# Patient Record
Sex: Female | Born: 1970 | Race: Black or African American | Hispanic: Yes | Smoking: Former smoker
Health system: Southern US, Community
[De-identification: ages and names within clinical notes are randomized; demographics above are authoritative.]

## PROBLEM LIST (undated history)

## (undated) ENCOUNTER — Ambulatory Visit (HOSPITAL_COMMUNITY): Admission: EM | Source: Home / Self Care

## (undated) DIAGNOSIS — E119 Type 2 diabetes mellitus without complications: Secondary | ICD-10-CM

## (undated) DIAGNOSIS — J45909 Unspecified asthma, uncomplicated: Secondary | ICD-10-CM

## (undated) DIAGNOSIS — I341 Nonrheumatic mitral (valve) prolapse: Secondary | ICD-10-CM

## (undated) DIAGNOSIS — I10 Essential (primary) hypertension: Secondary | ICD-10-CM

## (undated) HISTORY — PX: OTHER SURGICAL HISTORY: SHX169

## (undated) HISTORY — DX: Type 2 diabetes mellitus without complications: E11.9

## (undated) HISTORY — PX: EAR CYST EXCISION: SHX22

## (undated) HISTORY — PX: TUBAL LIGATION: SHX77

---

## 2018-09-29 DIAGNOSIS — N926 Irregular menstruation, unspecified: Secondary | ICD-10-CM | POA: Insufficient documentation

## 2018-09-29 DIAGNOSIS — Z0000 Encounter for general adult medical examination without abnormal findings: Secondary | ICD-10-CM | POA: Insufficient documentation

## 2018-09-29 DIAGNOSIS — I341 Nonrheumatic mitral (valve) prolapse: Secondary | ICD-10-CM | POA: Insufficient documentation

## 2018-09-29 DIAGNOSIS — J309 Allergic rhinitis, unspecified: Secondary | ICD-10-CM | POA: Insufficient documentation

## 2018-09-29 DIAGNOSIS — F17200 Nicotine dependence, unspecified, uncomplicated: Secondary | ICD-10-CM | POA: Insufficient documentation

## 2018-09-29 DIAGNOSIS — F32A Depression, unspecified: Secondary | ICD-10-CM | POA: Insufficient documentation

## 2019-07-19 DIAGNOSIS — T7840XA Allergy, unspecified, initial encounter: Secondary | ICD-10-CM | POA: Insufficient documentation

## 2020-01-31 ENCOUNTER — Ambulatory Visit
Admission: EM | Admit: 2020-01-31 | Discharge: 2020-01-31 | Disposition: A | Discharge status: Home / Self Care | Payer: PRIVATE HEALTH INSURANCE

## 2020-01-31 ENCOUNTER — Encounter: Payer: Self-pay | Admitting: Emergency Medicine

## 2020-01-31 ENCOUNTER — Ambulatory Visit (INDEPENDENT_AMBULATORY_CARE_PROVIDER_SITE_OTHER): Payer: PRIVATE HEALTH INSURANCE

## 2020-01-31 ENCOUNTER — Other Ambulatory Visit: Payer: Self-pay

## 2020-01-31 DIAGNOSIS — M79675 Pain in left toe(s): Secondary | ICD-10-CM | POA: Diagnosis not present

## 2020-01-31 DIAGNOSIS — M79672 Pain in left foot: Secondary | ICD-10-CM | POA: Diagnosis not present

## 2020-01-31 DIAGNOSIS — W208XXA Other cause of strike by thrown, projected or falling object, initial encounter: Secondary | ICD-10-CM | POA: Diagnosis not present

## 2020-01-31 DIAGNOSIS — S92525A Nondisplaced fracture of medial phalanx of left lesser toe(s), initial encounter for closed fracture: Secondary | ICD-10-CM | POA: Diagnosis not present

## 2020-01-31 HISTORY — DX: Essential (primary) hypertension: I10

## 2020-01-31 NOTE — ED Triage Notes (Signed)
Pt presents to [facility] for assessment of left pinky toe pain after dropping a shade on her foot 1 week ago.  Sttes she has been trying home remedies without relief, and states moving in her sleep can wake her up.

## 2020-01-31 NOTE — Discharge Instructions (Signed)
Recommend RICE: rest, ice, compression, elevation as needed for pain.    Heat therapy (hot compress, warm wash rag, hot showers, etc.) can help relax muscles and soothe muscle aches. Cold therapy (ice packs) can be used to help swelling both after injury and after prolonged use of areas of chronic pain/aches.  For pain: recommend 350 mg-1000 mg of Tylenol (acetaminophen) and/or 200 mg - 800 mg of Advil (ibuprofen, Motrin) every 8 hours as needed.  May alternate between the two throughout the day as they are generally safe to take together.  DO NOT exceed more than 3000 mg of Tylenol or 3200 mg of ibuprofen in a 24 hour period as this could damage your stomach, kidneys, liver, or increase your bleeding risk.  Important to follow up with ortho in 1 week!

## 2020-01-31 NOTE — ED Provider Notes (Signed)
[name]    CSN: [id number] Arrival date & time: 01/31/20  1531      History   Chief Complaint Chief Complaint  Patient presents with  . Foot Pain    HPI Laura Burns is a 49 y.o. female with history of hypertension presenting for persistent left small toe pain and swelling.  Patient states that she dropped her lampshade on a week ago.  Has applied ice, elevate, taken ibuprofen, though still in persistent pain.  Has been fully weightbearing.  Denies numbness or deformity.  Requesting x-ray to see if it's broken.   Past Medical History:  Diagnosis Date  . Hypertension     There are no problems to display for this patient.   Past Surgical History:  Procedure Laterality Date  . CESAREAN SECTION      OB History   No obstetric history on file.      Home Medications    Prior to Admission medications   Medication Sig Start Date End Date Taking? Authorizing Provider  albuterol (VENTOLIN HFA) 108 (90 Base) MCG/ACT inhaler Inhale into the lungs every 6 (six) hours as needed for wheezing or shortness of breath.   Yes [provider]  fluticasone-salmeterol (ADVAIR HFA) 115-21 MCG/ACT inhaler Inhale 2 puffs into the lungs 2 (two) times daily.   Yes [provider]  loratadine (CLARITIN) 10 MG tablet Take 10 mg by mouth daily.   Yes [provider]  losartan (COZAAR) 25 MG tablet Take 25 mg by mouth daily.   Yes [provider]    Family History Family History  Problem Relation Age of Onset  . Diabetes Mother   . Hypothyroidism Mother   . Diabetes Father     Social History Social History   Tobacco Use  . Smoking status: Current Some Day Smoker  . Smokeless tobacco: Never Used  . Tobacco comment: "when I'm drinking"  Substance Use Topics  . Alcohol use: Yes    Comment: socially  . Drug use: Never     Allergies   Patient has no known allergies.   Review of Systems As per HPI   Physical Exam Triage  Vital Signs ED Triage Vitals  Enc Vitals Group     BP      Pulse      Resp      Temp      Temp src      SpO2      Weight      Height      Head Circumference      Peak Flow      Pain Score      Pain Loc      Pain Edu?      Excl. in GC?    No data found.  Updated Vital Signs BP (!) 142/98 (BP Location: Left Arm)   Pulse 76   Temp 98 F (36.7 C)   Resp 18   SpO2 95%   Visual Acuity Right Eye Distance:   Left Eye Distance:   Bilateral Distance:    Right Eye Near:   Left Eye Near:    Bilateral Near:     Physical Exam Constitutional:      General: She is not in acute distress. HENT:     Head: Normocephalic and atraumatic.  Eyes:     General: No scleral icterus.    Pupils: Pupils are equal, round, and reactive to light.  Cardiovascular:     Rate and Rhythm: Normal  rate.  Pulmonary:     Effort: Pulmonary effort is normal.  Musculoskeletal:        General: Swelling and tenderness present. Normal range of motion.     Comments: Foot, fifth toe NVI  Skin:    Capillary Refill: Capillary refill takes less than 2 seconds.     Coloration: Skin is not jaundiced or pale.  Neurological:     Mental Status: She is alert and oriented to person, place, and time.      UC Treatments / Results  Labs (all labs ordered are listed, but only abnormal results are displayed) Labs Reviewed - No data to display  EKG   Radiology DG Foot Complete Left  Result Date: 01/31/2020 CLINICAL DATA:  Left foot pain after injury. Small toe pain. Blinds fell on foot. EXAM: LEFT FOOT - COMPLETE 3+ VIEW COMPARISON:  None. FINDINGS: Oblique nondisplaced fracture of the little toe middle phalanx. 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 There is no intra-articular extension to the proximal interphalangeal joint. No additional fracture of the foot. Mild hallux valgus with minimal degenerative change the first metatarsal phalangeal joint. IMPRESSION: Oblique nondisplaced  fracture of the little toe middle phalanx. Electronically Signed   By: Narda Rutherford M.D.   On: 01/31/2020 16:30    Procedures Procedures (including critical care time)  Medications Ordered in UC Medications - No data to display  Initial Impression / Assessment and Plan / UC Course  I have reviewed the triage vital signs and the nursing notes.  Pertinent labs & imaging results that were available during my care of the patient were reviewed by me and considered in my medical decision making (see chart for details).     Left foot x-ray done office, reviewed by me radiology: Significant for oblique, nondisplaced fracture of little toe middle phalanx.  Ace wrap applied in office, crutches given: Patient instructed to be nonweightbearing and follow-up with orthopedics within a week.  Return precautions discussed, patient verbalized understanding and is agreeable to plan. Final Clinical Impressions(s) / UC Diagnoses   Final diagnoses:  Closed nondisplaced fracture of middle phalanx of lesser toe of left foot, initial encounter     Discharge Instructions     Recommend RICE: rest, ice, compression, elevation as needed for pain.    Heat therapy (hot compress, warm wash rag, hot showers, etc.) can help relax muscles and soothe muscle aches. Cold therapy (ice packs) can be used to help swelling both after injury and after prolonged use of areas of chronic pain/aches.  For pain: recommend 350 mg-1000 mg of Tylenol (acetaminophen) and/or 200 mg - 800 mg of Advil (ibuprofen, Motrin) every 8 hours as needed.  May alternate between the two throughout the day as they are generally safe to take together.  DO NOT exceed more than 3000 mg of Tylenol or 3200 mg of ibuprofen in a 24 hour period as this could damage your stomach, kidneys, liver, or increase your bleeding risk.  Important to follow up with ortho in 1 week!    ED Prescriptions    None     PDMP not reviewed this encounter.    Hall[place]Potvin, [place], [place] 01/31/20 1701

## 2020-02-06 DIAGNOSIS — Z6829 Body mass index (BMI) 29.0-29.9, adult: Secondary | ICD-10-CM | POA: Insufficient documentation

## 2020-02-06 DIAGNOSIS — M79602 Pain in left arm: Secondary | ICD-10-CM | POA: Insufficient documentation

## 2020-02-13 DIAGNOSIS — E782 Mixed hyperlipidemia: Secondary | ICD-10-CM | POA: Insufficient documentation

## 2020-02-13 DIAGNOSIS — E559 Vitamin D deficiency, unspecified: Secondary | ICD-10-CM | POA: Insufficient documentation

## 2020-02-13 DIAGNOSIS — R7303 Prediabetes: Secondary | ICD-10-CM | POA: Insufficient documentation

## 2020-03-15 ENCOUNTER — Other Ambulatory Visit: Payer: Self-pay

## 2020-03-15 ENCOUNTER — Emergency Department (HOSPITAL_BASED_OUTPATIENT_CLINIC_OR_DEPARTMENT_OTHER)
Admission: EM | Admit: 2020-03-15 | Discharge: 2020-03-15 | Disposition: A | Discharge status: Home / Self Care | Payer: PRIVATE HEALTH INSURANCE | Attending: Emergency Medicine | Admitting: Emergency Medicine

## 2020-03-15 ENCOUNTER — Emergency Department (HOSPITAL_BASED_OUTPATIENT_CLINIC_OR_DEPARTMENT_OTHER): Payer: PRIVATE HEALTH INSURANCE

## 2020-03-15 ENCOUNTER — Encounter (HOSPITAL_BASED_OUTPATIENT_CLINIC_OR_DEPARTMENT_OTHER): Payer: Self-pay | Admitting: Emergency Medicine

## 2020-03-15 DIAGNOSIS — F17200 Nicotine dependence, unspecified, uncomplicated: Secondary | ICD-10-CM | POA: Diagnosis not present

## 2020-03-15 DIAGNOSIS — I10 Essential (primary) hypertension: Secondary | ICD-10-CM | POA: Diagnosis not present

## 2020-03-15 DIAGNOSIS — Z20822 Contact with and (suspected) exposure to covid-19: Secondary | ICD-10-CM | POA: Diagnosis not present

## 2020-03-15 DIAGNOSIS — Z79899 Other long term (current) drug therapy: Secondary | ICD-10-CM | POA: Diagnosis not present

## 2020-03-15 DIAGNOSIS — R0602 Shortness of breath: Secondary | ICD-10-CM | POA: Diagnosis present

## 2020-03-15 DIAGNOSIS — J45901 Unspecified asthma with (acute) exacerbation: Secondary | ICD-10-CM | POA: Diagnosis not present

## 2020-03-15 HISTORY — DX: Unspecified asthma, uncomplicated: J45.909

## 2020-03-15 LAB — SARS CORONAVIRUS 2 BY RT PCR (HOSPITAL ORDER, PERFORMED IN ~~LOC~~ HOSPITAL LAB): SARS Coronavirus 2: NEGATIVE

## 2020-03-15 MED ORDER — ALBUTEROL SULFATE HFA 108 (90 BASE) MCG/ACT IN AERS
8.0000 | INHALATION_SPRAY | Freq: Once | RESPIRATORY_TRACT | Status: AC
  Administered 2020-03-15: 8 via RESPIRATORY_TRACT
  Filled 2020-03-15: qty 6.7

## 2020-03-15 MED ORDER — PREDNISONE 20 MG PO TABS: 60.0000 mg | ORAL_TABLET | Freq: Every day | ORAL | 0 refills | Status: DC

## 2020-03-15 MED ORDER — MAGNESIUM SULFATE 50 % IJ SOLN
1.0000 g | Freq: Once | INTRAMUSCULAR | Status: AC
  Administered 2020-03-15: 1 g via INTRAVENOUS
  Filled 2020-03-15: qty 2

## 2020-03-15 MED ORDER — ALBUTEROL SULFATE (2.5 MG/3ML) 0.083% IN NEBU
2.5000 mg | INHALATION_SOLUTION | Freq: Four times a day (QID) | RESPIRATORY_TRACT | 12 refills | Status: DC | PRN
Start: 2020-03-15 — End: 2020-05-15

## 2020-03-15 NOTE — ED Provider Notes (Signed)
[facility] EMERGENCY DEPARTMENT Provider Note   CSN: [id number] Arrival date & time: 03/15/20  0900     History Chief Complaint  Patient presents with  . Shortness of Breath    Laura Burns is a 49 y.o. female.  The history is provided by the patient.  Shortness of Breath Severity:  Mild Onset quality:  Gradual Timing:  Intermittent Progression:  Waxing and waning Chronicity:  New Context: activity (asthma symptoms, improved with nebulizer with EMS)   Relieved by:  Inhaler Worsened by:  Deep breathing and coughing Associated symptoms: wheezing   Associated symptoms: no abdominal pain, no chest pain, no claudication, no cough, no diaphoresis, no ear pain, no fever, no rash, no sore throat, no sputum production and no vomiting        Past Medical History:  Diagnosis Date  . Asthma   . Hypertension     There are no problems to display for this patient.   Past Surgical History:  Procedure Laterality Date  . CESAREAN SECTION       OB History   No obstetric history on file.     Family History  Problem Relation Age of Onset  . Diabetes Mother   . Hypothyroidism Mother   . Diabetes Father     Social History   Tobacco Use  . Smoking status: Current Some Day Smoker  . Smokeless tobacco: Never Used  . Tobacco comment: "when I'm drinking"  Substance Use Topics  . Alcohol use: Yes    Comment: socially  . Drug use: Never    Home Medications Prior to Admission medications   Medication Sig Start Date End Date Taking? Authorizing Provider  albuterol (PROVENTIL) (2.5 MG/3ML) 0.083% nebulizer solution Take 3 mLs (2.5 mg total) by nebulization every 6 (six) hours as needed for wheezing or shortness of breath. 03/15/20   Laura Daum, DO  albuterol (VENTOLIN HFA) 108 (90 Base) MCG/ACT inhaler Inhale into the lungs every 6 (six) hours as needed for wheezing or shortness of breath.    [provider]  fluticasone-salmeterol (ADVAIR HFA) 115-21  MCG/ACT inhaler Inhale 2 puffs into the lungs 2 (two) times daily.    [provider]  loratadine (CLARITIN) 10 MG tablet Take 10 mg by mouth daily.    [provider]  losartan (COZAAR) 25 MG tablet Take 25 mg by mouth daily.    [provider]  predniSONE (DELTASONE) 20 MG tablet Take 3 tablets (60 mg total) by mouth daily for 5 days. 03/15/20 03/20/20  Laura Eutsler, DO    Allergies    Pollen extract, Dust mite extract, and Losartan  Review of Systems   Review of Systems  Constitutional: Negative for chills, diaphoresis and fever.  HENT: Negative for ear pain and sore throat.   Eyes: Negative for pain and visual disturbance.  Respiratory: Positive for shortness of breath and wheezing. Negative for cough and sputum production.   Cardiovascular: Negative for chest pain, palpitations and claudication.  Gastrointestinal: Negative for abdominal pain and vomiting.  Genitourinary: Negative for dysuria and hematuria.  Musculoskeletal: Negative for arthralgias and back pain.  Skin: Negative for color change and rash.  Neurological: Negative for seizures and syncope.  All other systems reviewed and are negative.   Physical Exam Updated Vital Signs  ED Triage Vitals  Enc Vitals Group     BP 03/15/20 0911 (!) 148/101     Pulse Rate 03/15/20 0911 87     Resp 03/15/20 0911 (!) 24  Temp 03/15/20 0911 97.9 F (36.6 C)     Temp src --      SpO2 03/15/20 0905 99 %     Weight 03/15/20 0911 148 lb (67.1 kg)     Height 03/15/20 0911 5\' 1"  (1.549 m)     Head Circumference --      Peak Flow --      Pain Score 03/15/20 0918 2     Pain Loc --      Pain Edu? --      Excl. in GC? --     Physical Exam Vitals and nursing note reviewed.  Constitutional:      General: She is not in acute distress.    Appearance: She is well-developed. She is not ill-appearing.  HENT:     Head: Normocephalic and atraumatic.  Eyes:     Conjunctiva/sclera: Conjunctivae normal.      Pupils: Pupils are equal, round, and reactive to light.  Cardiovascular:     Rate and Rhythm: Normal rate and regular rhythm.     Pulses: Normal pulses.     Heart sounds: Normal heart sounds. No murmur heard.   Pulmonary:     Effort: Pulmonary effort is normal. No respiratory distress.     Breath sounds: Wheezing present. No decreased breath sounds.  Abdominal:     Palpations: Abdomen is soft.     Tenderness: There is no abdominal tenderness.  Musculoskeletal:     Cervical back: Normal range of motion and neck supple.     Right lower leg: No edema.     Left lower leg: No edema.  Skin:    General: Skin is warm and dry.     Capillary Refill: Capillary refill takes less than 2 seconds.  Neurological:     General: No focal deficit present.     Mental Status: She is alert.     ED Results / Procedures / Treatments   Labs (all labs ordered are listed, but only abnormal results are displayed) Labs Reviewed  SARS CORONAVIRUS 2 BY RT PCR (HOSPITAL ORDER, PERFORMED IN [facility])    EKG None  Radiology DG Chest Portable 1 View  Result Date: 03/15/2020 CLINICAL DATA:  Asthma EXAM: PORTABLE CHEST 1 VIEW COMPARISON:  None. FINDINGS: Lungs are clear. Heart size and pulmonary vascularity are normal. No adenopathy. There is midthoracic dextroscoliosis. IMPRESSION: Lungs clear.  Cardiac silhouette normal. Electronically Signed   By: 03/17/2020 III M.D.   On: 03/15/2020 10:18    Procedures Procedures (including critical care time)  Medications Ordered in ED Medications  albuterol (VENTOLIN HFA) 108 (90 Base) MCG/ACT inhaler 8 puff (8 puffs Inhalation Given 03/15/20 1012)  magnesium sulfate (IV Push/IM) injection 1 g (1 g Intravenous Given 03/15/20 1028)    ED Course  I have reviewed the triage vital signs and the nursing notes.  Pertinent labs & imaging results that were available during my care of the patient were reviewed by me and considered in my medical  decision making (see chart for details).    MDM Rules/Calculators/A&P                          Laura Burns is a 49 year old female history of asthma who presents the ED with shortness of breath, wheezing, asthma attack.  Normal vitals.  No fever.  No increased work of breathing.  No hypoxia.  Has been using home inhaler with minimal relief.  Got nebulizer,  steroids with EMS and feels better.  Has fairly good air movement on exam.  Will give further breathing treatments, magnesium.  Chest x-ray showed no signs of infection.  She is vaccinated against coronavirus but will check Covid test.  Chest x-ray negative for infection.  Patient felt much better after breathing treatment and magnesium.  Will prescribe steroids.  Understands return precautions.  Discharged in good condition.  This chart was dictated using voice recognition software.  Despite best efforts to proofread,  errors can occur which can change the documentation meaning.    Final Clinical Impression(s) / ED Diagnoses Final diagnoses:  Mild asthma with exacerbation, unspecified whether persistent    Rx / DC Orders ED Discharge Orders         Ordered    predniSONE (DELTASONE) 20 MG tablet  Daily        03/15/20 1044    albuterol (PROVENTIL) (2.5 MG/3ML) 0.083% nebulizer solution  Every 6 hours PRN        03/15/20 1044           Laurencia Roma, DO 03/15/20 1120

## 2020-03-15 NOTE — ED Triage Notes (Addendum)
Per EMS:  Pt out of medications.  Pt received solumedrol, 3 neb treatments, atrovent and albuterol.  IV to left hand.  No hypoxia.  Pt has received Pfizer vaccinations.  Pt has improved after medications.

## 2020-03-15 NOTE — ED Notes (Signed)
Patient given 15 mg Albuterol, 1.0 mg Atrovent via EMS.  BBS expiratory wheezes t/o, SpO2 98% RR 24. From [place] out of meds, speaking in complete sentences at this time.

## 2020-05-12 ENCOUNTER — Encounter (HOSPITAL_BASED_OUTPATIENT_CLINIC_OR_DEPARTMENT_OTHER): Payer: Self-pay | Admitting: Emergency Medicine

## 2020-05-12 ENCOUNTER — Inpatient Hospital Stay (HOSPITAL_BASED_OUTPATIENT_CLINIC_OR_DEPARTMENT_OTHER)
Admission: EM | Admit: 2020-05-12 | Discharge: 2020-05-15 | DRG: 203 | Disposition: A | Discharge status: Home / Self Care | Payer: Medicaid Other | Attending: Internal Medicine | Admitting: Internal Medicine

## 2020-05-12 ENCOUNTER — Other Ambulatory Visit: Payer: Self-pay

## 2020-05-12 ENCOUNTER — Emergency Department (HOSPITAL_BASED_OUTPATIENT_CLINIC_OR_DEPARTMENT_OTHER): Payer: Medicaid Other

## 2020-05-12 DIAGNOSIS — I10 Essential (primary) hypertension: Secondary | ICD-10-CM | POA: Diagnosis present

## 2020-05-12 DIAGNOSIS — E876 Hypokalemia: Secondary | ICD-10-CM | POA: Diagnosis present

## 2020-05-12 DIAGNOSIS — F17200 Nicotine dependence, unspecified, uncomplicated: Secondary | ICD-10-CM | POA: Diagnosis present

## 2020-05-12 DIAGNOSIS — Z7951 Long term (current) use of inhaled steroids: Secondary | ICD-10-CM

## 2020-05-12 DIAGNOSIS — Z79899 Other long term (current) drug therapy: Secondary | ICD-10-CM

## 2020-05-12 DIAGNOSIS — J45901 Unspecified asthma with (acute) exacerbation: Secondary | ICD-10-CM | POA: Diagnosis present

## 2020-05-12 DIAGNOSIS — J4540 Moderate persistent asthma, uncomplicated: Secondary | ICD-10-CM

## 2020-05-12 DIAGNOSIS — Z23 Encounter for immunization: Secondary | ICD-10-CM

## 2020-05-12 DIAGNOSIS — Z20822 Contact with and (suspected) exposure to covid-19: Secondary | ICD-10-CM | POA: Diagnosis present

## 2020-05-12 DIAGNOSIS — J4541 Moderate persistent asthma with (acute) exacerbation: Principal | ICD-10-CM | POA: Diagnosis present

## 2020-05-12 DIAGNOSIS — R519 Headache, unspecified: Secondary | ICD-10-CM | POA: Diagnosis present

## 2020-05-12 DIAGNOSIS — E785 Hyperlipidemia, unspecified: Secondary | ICD-10-CM | POA: Diagnosis present

## 2020-05-12 LAB — BASIC METABOLIC PANEL
Anion gap: 9 (ref 5–15)
BUN: 13 mg/dL (ref 6–20)
CO2: 26 mmol/L (ref 22–32)
Calcium: 9.1 mg/dL (ref 8.9–10.3)
Chloride: 107 mmol/L (ref 98–111)
Creatinine, Ser: 0.98 mg/dL (ref 0.44–1.00)
GFR, Estimated: >60 mL/min (ref >60)
Glucose, Bld: 110 mg/dL — ABNORMAL HIGH (ref 70–99)
Potassium: 4 mmol/L (ref 3.5–5.1)
Sodium: 142 mmol/L (ref 135–145)

## 2020-05-12 LAB — CBC WITH DIFFERENTIAL/PLATELET
Abs Immature Granulocytes: 0.02 10*3/uL (ref 0.00–0.07)
Basophils Absolute: 0 10*3/uL (ref 0.0–0.1)
Basophils Relative: 1 %
Eosinophils Absolute: 1.7 10*3/uL — ABNORMAL HIGH (ref 0.0–0.5)
Eosinophils Relative: 20 %
HCT: 43.9 % (ref 36.0–46.0)
Hemoglobin: 14.6 g/dL (ref 12.0–15.0)
Immature Granulocytes: 0 %
Lymphocytes Relative: 35 %
Lymphs Abs: 3 10*3/uL (ref 0.7–4.0)
MCH: 30.8 pg (ref 26.0–34.0)
MCHC: 33.3 g/dL (ref 30.0–36.0)
MCV: 92.6 fL (ref 80.0–100.0)
Monocytes Absolute: 0.7 10*3/uL (ref 0.1–1.0)
Monocytes Relative: 8 %
Neutro Abs: 3 10*3/uL (ref 1.7–7.7)
Neutrophils Relative %: 36 %
Platelets: 245 10*3/uL (ref 150–400)
RBC: 4.74 MIL/uL (ref 3.87–5.11)
RDW: 12.8 % (ref 11.5–15.5)
WBC: 8.4 10*3/uL (ref 4.0–10.5)
nRBC: 0 % (ref 0.0–0.2)

## 2020-05-12 LAB — RESPIRATORY PANEL BY RT PCR (FLU A&B, COVID)
Influenza A by PCR: NEGATIVE
Influenza B by PCR: NEGATIVE
SARS Coronavirus 2 by RT PCR: NEGATIVE

## 2020-05-12 MED ORDER — ACETAMINOPHEN 325 MG PO TABS
650.0000 mg | ORAL_TABLET | Freq: Once | ORAL | Status: AC
  Administered 2020-05-12: 650 mg via ORAL
  Filled 2020-05-12: qty 2

## 2020-05-12 MED ORDER — SODIUM CHLORIDE 0.9 % IV SOLN
INTRAVENOUS | Status: DC | PRN
  Administered 2020-05-12: 250 mL via INTRAVENOUS

## 2020-05-12 MED ORDER — ALBUTEROL (5 MG/ML) CONTINUOUS INHALATION SOLN: 15.0000 mg/h | INHALATION_SOLUTION | RESPIRATORY_TRACT | Status: AC

## 2020-05-12 MED ORDER — MAGNESIUM SULFATE 2 GM/50ML IV SOLN
2.0000 g | Freq: Once | INTRAVENOUS | Status: AC
  Administered 2020-05-12: 2 g via INTRAVENOUS
  Filled 2020-05-12: qty 50

## 2020-05-12 MED ORDER — METHYLPREDNISOLONE SODIUM SUCC 125 MG IJ SOLR
125.0000 mg | Freq: Once | INTRAMUSCULAR | Status: AC
  Administered 2020-05-12: 125 mg via INTRAVENOUS
  Filled 2020-05-12: qty 2

## 2020-05-12 MED ORDER — ALBUTEROL SULFATE (2.5 MG/3ML) 0.083% IN NEBU
2.5000 mg | INHALATION_SOLUTION | Freq: Once | RESPIRATORY_TRACT | Status: AC
  Administered 2020-05-12: 2.5 mg via RESPIRATORY_TRACT

## 2020-05-12 MED ORDER — ALBUTEROL (5 MG/ML) CONTINUOUS INHALATION SOLN
15.0000 mg/h | INHALATION_SOLUTION | RESPIRATORY_TRACT | Status: DC
  Administered 2020-05-12: 15 mg/h via RESPIRATORY_TRACT

## 2020-05-12 MED ORDER — ALBUTEROL (5 MG/ML) CONTINUOUS INHALATION SOLN
INHALATION_SOLUTION | RESPIRATORY_TRACT | Status: AC
  Administered 2020-05-12: 15 mg/h via RESPIRATORY_TRACT
  Filled 2020-05-12: qty 20

## 2020-05-12 MED ORDER — IPRATROPIUM-ALBUTEROL 0.5-2.5 (3) MG/3ML IN SOLN
3.0000 mL | Freq: Once | RESPIRATORY_TRACT | Status: AC
  Administered 2020-05-12: 3 mL via RESPIRATORY_TRACT

## 2020-05-12 NOTE — ED Triage Notes (Signed)
Pt to ED with c/o SHOB, worsening x 1 week; difficult to obtain hx d/t pt resp distress

## 2020-05-12 NOTE — ED Provider Notes (Signed)
[facility] EMERGENCY DEPARTMENT Provider Note  CSN: [id number] Arrival date & time: 05/12/20 1935    History Chief Complaint  Patient presents with  . Shortness of Breath    HPI  Laura Burns is a 49 y.o. female with history of asthma who reports she has had increasing SOB, wheezing for the last week or so, taking her inhaler and nebs at home but still worsening until today when she got bad enough to come to the hospital. No fever. Does not think she has covid. No CP   Past Medical History:  Diagnosis Date  . Asthma   . Hypertension     Past Surgical History:  Procedure Laterality Date  . CESAREAN SECTION      Family History  Problem Relation Age of Onset  . Diabetes Mother   . Hypothyroidism Mother   . Diabetes Father     Social History   Tobacco Use  . Smoking status: Current Some Day Smoker  . Smokeless tobacco: Never Used  . Tobacco comment: "when I'm drinking"  Substance Use Topics  . Alcohol use: Yes    Comment: socially  . Drug use: Never     Home Medications Prior to Admission medications   Medication Sig Start Date End Date Taking? Authorizing Provider  albuterol (PROVENTIL) (2.5 MG/3ML) 0.083% nebulizer solution Take 3 mLs (2.5 mg total) by nebulization every 6 (six) hours as needed for wheezing or shortness of breath. 03/15/20   Curatolo, Adam, DO  albuterol (VENTOLIN HFA) 108 (90 Base) MCG/ACT inhaler Inhale into the lungs every 6 (six) hours as needed for wheezing or shortness of breath.    [provider]  fluticasone-salmeterol (ADVAIR HFA) 115-21 MCG/ACT inhaler Inhale 2 puffs into the lungs 2 (two) times daily.    [provider]  loratadine (CLARITIN) 10 MG tablet Take 10 mg by mouth daily.    [provider]  losartan (COZAAR) 25 MG tablet Take 25 mg by mouth daily.    [provider]     Allergies    Pollen extract, Dust mite extract, and Losartan   Review of Systems   Review of  Systems A comprehensive review of systems was completed and negative except as noted in HPI.    Physical Exam BP (!) 134/110 Comment: 11 L aerosol mask  Pulse (!) 124 Comment: 11 L aerosol mask  Resp (!) 29 Comment: 11 L aerosol mask  Ht 5\' 1"  (1.549 m)   Wt 72.6 kg   SpO2 100%   BMI 30.23 kg/m   Physical Exam Vitals and nursing note reviewed.  Constitutional:      General: She is in acute distress.     Appearance: Normal appearance.  HENT:     Head: Normocephalic and atraumatic.     Nose: Nose normal.     Mouth/Throat:     Mouth: Mucous membranes are moist.  Eyes:     Extraocular Movements: Extraocular movements intact.     Conjunctiva/sclera: Conjunctivae normal.  Cardiovascular:     Rate and Rhythm: Normal rate.  Pulmonary:     Effort: Tachypnea, accessory muscle usage and respiratory distress present.     Breath sounds: Wheezing present.  Abdominal:     General: Abdomen is flat.     Palpations: Abdomen is soft.     Tenderness: There is no abdominal tenderness.  Musculoskeletal:        General: No swelling. Normal range of motion.     Cervical back: Neck  supple.  Skin:    General: Skin is warm and dry.  Neurological:     General: No focal deficit present.     Mental Status: She is alert.  Psychiatric:        Mood and Affect: Mood normal.      ED Results / Procedures / Treatments   Labs (all labs ordered are listed, but only abnormal results are displayed) Labs Reviewed  BASIC METABOLIC PANEL - Abnormal; Notable for the following components:      Result Value   Glucose, Bld 110 (*)    All other components within normal limits  CBC WITH DIFFERENTIAL/PLATELET - Abnormal; Notable for the following components:   Eosinophils Absolute 1.7 (*)    All other components within normal limits  RESPIRATORY PANEL BY RT PCR (FLU A&B, COVID)    EKG EKG Interpretation  Date/Time:  Saturday May 12 2020 19:45:25 EDT Ventricular Rate:  113 PR Interval:    QRS  Duration: 116 QT Interval:  333 QTC Calculation: 457 R Axis:   66 Text Interpretation: Sinus tachycardia Nonspecific intraventricular conduction delay Artifact in lead(s) I II aVR aVL aVF and baseline wander in lead(s) V3 No old tracing to compare Confirmed by Susy Frizzle [id number]) on 05/12/2020 7:54:41 PM    Radiology DG Chest Port 1 View  Result Date: 05/12/2020 CLINICAL DATA:  Shortness of breath EXAM: PORTABLE CHEST 1 VIEW COMPARISON:  03/15/2020 FINDINGS: The heart size and mediastinal contours are within normal limits. Both lungs are clear. The visualized skeletal structures are unremarkable. IMPRESSION: No active disease. Electronically Signed   By: Alcide Clever M.D.   On: 05/12/2020 20:12    Procedures .Critical Care Performed by: Pollyann Savoy, MD Authorized by: Pollyann Savoy, MD   Critical care provider statement:    Critical care time (minutes):  45   Critical care was necessary to treat or prevent imminent or life-threatening deterioration of the following conditions:  Respiratory failure   Critical care was time spent personally by me on the following activities:  Discussions with consultants, evaluation of patient's response to treatment, examination of patient, ordering and performing treatments and interventions, ordering and review of laboratory studies, ordering and review of radiographic studies, pulse oximetry, re-evaluation of patient's condition, obtaining history from patient or surrogate and review of old charts    Medications Ordered in the ED Medications  albuterol (PROVENTIL,VENTOLIN) solution continuous neb (0 mg/hr Nebulization Stopped 05/12/20 2136)  0.9 %  sodium chloride infusion (250 mLs Intravenous New Bag/Given 05/12/20 2016)  albuterol (PROVENTIL,VENTOLIN) solution continuous neb (15 mg/hr Nebulization New Bag/Given 05/12/20 2141)  ipratropium-albuterol (DUONEB) 0.5-2.5 (3) MG/3ML nebulizer solution 3 mL (3 mLs Nebulization Given  05/12/20 2005)  magnesium sulfate IVPB 2 g 50 mL ( Intravenous Stopped 05/12/20 2120)  methylPREDNISolone sodium succinate (SOLU-MEDROL) 125 mg/2 mL injection 125 mg (125 mg Intravenous Given 05/12/20 2010)  albuterol (PROVENTIL) (2.5 MG/3ML) 0.083% nebulizer solution 2.5 mg (2.5 mg Nebulization Given 05/12/20 2005)  acetaminophen (TYLENOL) tablet 650 mg (650 mg Oral Given 05/12/20 2209)     MDM Rules/Calculators/A&P MDM Patient likely asthma exacerbation, givne Duoneb and then placed on a 15mg  albuterol continuous neb. Magnesium and solumedrol ordered. Labs and CXR, Covid swab.  ED Course  I have reviewed the triage vital signs and the nursing notes.  Pertinent labs & imaging results that were available during my care of the patient were reviewed by me and considered in my medical decision making (see chart for details).  Clinical  Course as of May 12 2253  Sat May 12, 2020  2038 CBC, CMP and CXR are all unremarkable.    [CS]  2102 Covid is negative. Plan admission for asthma exacerbation.    [CS]  2155 Patient continues to have significant wheezing, improved WOB and SpO2, hospitalist paged.    [CS]  2245 Spoke with Dr. Antionette Char, Hospitalist, who will admit.    [CS]    Clinical Course User Index [CS] Pollyann Savoy, MD    Final Clinical Impression(s) / ED Diagnoses Final diagnoses:  Exacerbation of asthma, unspecified asthma severity, unspecified whether persistent    Rx / DC Orders ED Discharge Orders    None       Pollyann Savoy, MD 05/12/20 2255

## 2020-05-13 DIAGNOSIS — J4541 Moderate persistent asthma with (acute) exacerbation: Principal | ICD-10-CM

## 2020-05-13 DIAGNOSIS — R519 Headache, unspecified: Secondary | ICD-10-CM | POA: Diagnosis present

## 2020-05-13 DIAGNOSIS — Z23 Encounter for immunization: Secondary | ICD-10-CM | POA: Diagnosis not present

## 2020-05-13 DIAGNOSIS — J4540 Moderate persistent asthma, uncomplicated: Secondary | ICD-10-CM

## 2020-05-13 DIAGNOSIS — E876 Hypokalemia: Secondary | ICD-10-CM | POA: Diagnosis present

## 2020-05-13 DIAGNOSIS — I10 Essential (primary) hypertension: Secondary | ICD-10-CM

## 2020-05-13 DIAGNOSIS — E785 Hyperlipidemia, unspecified: Secondary | ICD-10-CM | POA: Diagnosis present

## 2020-05-13 DIAGNOSIS — Z20822 Contact with and (suspected) exposure to covid-19: Secondary | ICD-10-CM | POA: Diagnosis present

## 2020-05-13 DIAGNOSIS — R0602 Shortness of breath: Secondary | ICD-10-CM | POA: Diagnosis present

## 2020-05-13 DIAGNOSIS — F17200 Nicotine dependence, unspecified, uncomplicated: Secondary | ICD-10-CM | POA: Diagnosis present

## 2020-05-13 DIAGNOSIS — Z79899 Other long term (current) drug therapy: Secondary | ICD-10-CM | POA: Diagnosis not present

## 2020-05-13 DIAGNOSIS — Z7951 Long term (current) use of inhaled steroids: Secondary | ICD-10-CM | POA: Diagnosis not present

## 2020-05-13 LAB — CBC WITH DIFFERENTIAL/PLATELET
Abs Immature Granulocytes: 0.02 10*3/uL (ref 0.00–0.07)
Basophils Absolute: 0 10*3/uL (ref 0.0–0.1)
Basophils Relative: 0 %
Eosinophils Absolute: 0 10*3/uL (ref 0.0–0.5)
Eosinophils Relative: 0 %
HCT: 41.4 % (ref 36.0–46.0)
Hemoglobin: 13.5 g/dL (ref 12.0–15.0)
Immature Granulocytes: 0 %
Lymphocytes Relative: 6 %
Lymphs Abs: 0.5 10*3/uL — ABNORMAL LOW (ref 0.7–4.0)
MCH: 30.9 pg (ref 26.0–34.0)
MCHC: 32.6 g/dL (ref 30.0–36.0)
MCV: 94.7 fL (ref 80.0–100.0)
Monocytes Absolute: 0.1 10*3/uL (ref 0.1–1.0)
Monocytes Relative: 1 %
Neutro Abs: 8.1 10*3/uL — ABNORMAL HIGH (ref 1.7–7.7)
Neutrophils Relative %: 93 %
Platelets: 193 10*3/uL (ref 150–400)
RBC: 4.37 MIL/uL (ref 3.87–5.11)
RDW: 12.9 % (ref 11.5–15.5)
WBC: 8.8 10*3/uL (ref 4.0–10.5)
nRBC: 0 % (ref 0.0–0.2)

## 2020-05-13 LAB — BASIC METABOLIC PANEL
Anion gap: 13 (ref 5–15)
BUN: 13 mg/dL (ref 6–20)
CO2: 19 mmol/L — ABNORMAL LOW (ref 22–32)
Calcium: 8.1 mg/dL — ABNORMAL LOW (ref 8.9–10.3)
Chloride: 106 mmol/L (ref 98–111)
Creatinine, Ser: 1 mg/dL (ref 0.44–1.00)
GFR, Estimated: >60 mL/min (ref >60)
Glucose, Bld: 273 mg/dL — ABNORMAL HIGH (ref 70–99)
Potassium: 3 mmol/L — ABNORMAL LOW (ref 3.5–5.1)
Sodium: 138 mmol/L (ref 135–145)

## 2020-05-13 LAB — MAGNESIUM: Magnesium: 2.3 mg/dL (ref 1.7–2.4)

## 2020-05-13 LAB — HIV ANTIBODY (ROUTINE TESTING W REFLEX): HIV Screen 4th Generation wRfx: NONREACTIVE

## 2020-05-13 LAB — MRSA PCR SCREENING: MRSA by PCR: NEGATIVE

## 2020-05-13 MED ORDER — LABETALOL HCL 5 MG/ML IV SOLN: 10.0000 mg | INTRAVENOUS | Status: DC | PRN

## 2020-05-13 MED ORDER — CHLORHEXIDINE GLUCONATE CLOTH 2 % EX PADS
6.0000 | MEDICATED_PAD | Freq: Every day | CUTANEOUS | Status: DC
  Administered 2020-05-13 – 2020-05-14 (×3): 6 via TOPICAL

## 2020-05-13 MED ORDER — IBUPROFEN 200 MG PO TABS
400.0000 mg | ORAL_TABLET | ORAL | Status: DC | PRN
  Administered 2020-05-13 (×2): 400 mg via ORAL
  Filled 2020-05-13 (×2): qty 2

## 2020-05-13 MED ORDER — INFLUENZA VAC SPLIT QUAD 0.5 ML IM SUSY
0.5000 mL | PREFILLED_SYRINGE | INTRAMUSCULAR | Status: AC
  Administered 2020-05-14: 0.5 mL via INTRAMUSCULAR
  Filled 2020-05-13: qty 0.5

## 2020-05-13 MED ORDER — ALBUTEROL (5 MG/ML) CONTINUOUS INHALATION SOLN
10.0000 mg/h | INHALATION_SOLUTION | RESPIRATORY_TRACT | Status: DC
  Administered 2020-05-13: 10 mg/h via RESPIRATORY_TRACT
  Filled 2020-05-13: qty 20

## 2020-05-13 MED ORDER — IPRATROPIUM-ALBUTEROL 0.5-2.5 (3) MG/3ML IN SOLN
3.0000 mL | Freq: Four times a day (QID) | RESPIRATORY_TRACT | Status: DC | PRN
  Administered 2020-05-13: 3 mL via RESPIRATORY_TRACT
  Filled 2020-05-13: qty 3

## 2020-05-13 MED ORDER — CHLORHEXIDINE GLUCONATE 0.12 % MT SOLN: 15.0000 mL | Freq: Two times a day (BID) | OROMUCOSAL | Status: DC

## 2020-05-13 MED ORDER — IPRATROPIUM BROMIDE 0.02 % IN SOLN
1.0000 mg | Freq: Once | RESPIRATORY_TRACT | Status: AC
  Administered 2020-05-13: 1 mg via RESPIRATORY_TRACT
  Filled 2020-05-13: qty 5

## 2020-05-13 MED ORDER — ACETAMINOPHEN 650 MG RE SUPP: 650.0000 mg | Freq: Four times a day (QID) | RECTAL | Status: DC | PRN

## 2020-05-13 MED ORDER — ENOXAPARIN SODIUM 40 MG/0.4ML ~~LOC~~ SOLN
40.0000 mg | Freq: Every day | SUBCUTANEOUS | Status: DC
  Administered 2020-05-13 – 2020-05-14 (×2): 40 mg via SUBCUTANEOUS
  Filled 2020-05-13 (×3): qty 0.4

## 2020-05-13 MED ORDER — IPRATROPIUM-ALBUTEROL 0.5-2.5 (3) MG/3ML IN SOLN
3.0000 mL | Freq: Four times a day (QID) | RESPIRATORY_TRACT | Status: DC
  Administered 2020-05-13 – 2020-05-15 (×9): 3 mL via RESPIRATORY_TRACT
  Filled 2020-05-13 (×9): qty 3

## 2020-05-13 MED ORDER — HYDROCOD POLST-CPM POLST ER 10-8 MG/5ML PO SUER
5.0000 mL | Freq: Two times a day (BID) | ORAL | Status: DC | PRN
  Administered 2020-05-13 – 2020-05-14 (×3): 5 mL via ORAL
  Filled 2020-05-13 (×3): qty 5

## 2020-05-13 MED ORDER — FLUTICASONE FUROATE-VILANTEROL 200-25 MCG/INH IN AEPB
1.0000 | INHALATION_SPRAY | Freq: Every day | RESPIRATORY_TRACT | Status: DC
  Administered 2020-05-13 – 2020-05-15 (×3): 1 via RESPIRATORY_TRACT
  Filled 2020-05-13: qty 28

## 2020-05-13 MED ORDER — ORAL CARE MOUTH RINSE: 15.0000 mL | Freq: Two times a day (BID) | OROMUCOSAL | Status: DC

## 2020-05-13 MED ORDER — PNEUMOCOCCAL VAC POLYVALENT 25 MCG/0.5ML IJ INJ
0.5000 mL | INJECTION | INTRAMUSCULAR | Status: AC
  Administered 2020-05-14: 0.5 mL via INTRAMUSCULAR
  Filled 2020-05-13: qty 0.5

## 2020-05-13 MED ORDER — METHYLPREDNISOLONE SODIUM SUCC 40 MG IJ SOLR
40.0000 mg | Freq: Two times a day (BID) | INTRAMUSCULAR | Status: DC
  Administered 2020-05-13 – 2020-05-14 (×4): 40 mg via INTRAVENOUS
  Filled 2020-05-13 (×6): qty 1

## 2020-05-13 MED ORDER — PREDNISONE 20 MG PO TABS
40.0000 mg | ORAL_TABLET | Freq: Every day | ORAL | Status: DC
  Administered 2020-05-13: 40 mg via ORAL
  Filled 2020-05-13: qty 2

## 2020-05-13 MED ORDER — ACETAMINOPHEN 325 MG PO TABS
650.0000 mg | ORAL_TABLET | Freq: Four times a day (QID) | ORAL | Status: DC | PRN
  Administered 2020-05-13 – 2020-05-14 (×3): 650 mg via ORAL
  Filled 2020-05-13 (×3): qty 2

## 2020-05-13 MED ORDER — POTASSIUM CHLORIDE CRYS ER 20 MEQ PO TBCR
40.0000 meq | EXTENDED_RELEASE_TABLET | ORAL | Status: AC
  Administered 2020-05-13 (×2): 40 meq via ORAL
  Filled 2020-05-13 (×2): qty 2

## 2020-05-13 MED ORDER — ORAL CARE MOUTH RINSE
15.0000 mL | Freq: Two times a day (BID) | OROMUCOSAL | Status: DC
  Administered 2020-05-13 – 2020-05-14 (×4): 15 mL via OROMUCOSAL

## 2020-05-13 MED ORDER — SODIUM CHLORIDE 0.9% FLUSH
3.0000 mL | Freq: Two times a day (BID) | INTRAVENOUS | Status: DC
  Administered 2020-05-13 – 2020-05-15 (×6): 3 mL via INTRAVENOUS

## 2020-05-13 MED ORDER — HYDRALAZINE HCL 25 MG PO TABS: 25.0000 mg | ORAL_TABLET | ORAL | Status: DC | PRN

## 2020-05-13 NOTE — Assessment & Plan Note (Signed)
-  Patient requests to be restarted on Advair at discharge -See asthma exacerbation treatment

## 2020-05-13 NOTE — Progress Notes (Signed)
PROGRESS NOTE    Laura Burns  KVQ:[id number] DOB: Dec 10, 1970 DOA: 05/12/2020 PCP: Patient, No Pcp Per   Chief Complain: Shortness of breath, wheezing  Brief Narrative: 49 year old female with history of moderate persistent asthma, hypertension who presents to med [facility] with worsening shortness of breath and wheezing at home.  She was taking Advair before but not taking now because she lost her insurance.  On presentation she was hemodynamically stable but tachycardic.  Chest x-ray did not show any infiltrates.  Lab works were normal.  Covid screening test was negative.  She required oxygenation for maintenance of saturation.  She is not on home oxygen.  She was treated with magnesium sulfate, Solu-Medrol, nebulization treatment.  Patient was admitted for the management of asthma exacerbation.  Assessment & Plan:   Principal Problem:   Acute asthma exacerbation Active Problems:   Moderate persistent asthma   Hypertension   Acute asthma exacerbation: Has history of moderate persistent asthma.  Presented with cough, wheezing, shortness of breath from home.  Currently not using any inhalers at home.  Started on bronchodilators, steroids.  Currently on prednisone, since she is still wheezing, will change to Solu-Medrol.  Continue current medications.  She is on 2 to 3 L of oxygen per minute.  Not on oxygen at home.  We will try to wean the oxygen to off.  Chest x-ray on presentation did not show any pneumonia.  Hypertension: Currently blood pressure stable.  Monitor blood pressure.  Takes losartan at home  Hyperlipidemia: On Lipitor at home.  Hypokalemia: Being supplemented with potassium.  We will try to arrange follow-up with a PCP, pulmonology as an outpatient during discharge.         DVT prophylaxis: Lovenox Code Status: Full Family Communication: None at bed side Status is: Inpatient  Remains inpatient appropriate because:Hemodynamically unstable and Inpatient  level of care appropriate due to severity of illness   Dispo: The patient is from: Home              Anticipated d/c is to: Home              Anticipated d/c date is: 1 day              Patient currently is not medically stable to d/c.        Consultants: None  Procedures: None  Antimicrobials:  Anti-infectives (From admission, onward)   None      Subjective: Patient seen and examined at bedside this morning.  Hemodynamically stable during my evaluation but she was in sinus tachycardia.  She still has severe bilateral expiratory wheezes.  States she gets 3-4 attacks a week recently but has been going on nonstop from July.  Objective: Vitals:   05/13/20 0400 05/13/20 0500 05/13/20 0600 05/13/20 0700  BP: (!) 91/42 (!) 125/50 (!) 128/49 (!) 132/58  Pulse: (!) 102 (!) 104 96 (!) 102  Resp: (!) 23 16 15 17   Temp: 97.6 F (36.4 C)     TempSrc: Oral     SpO2: 99% 97% 93% 92%  Weight:      Height:        Intake/Output Summary (Last 24 hours) at 05/13/2020 0738 Last data filed at 05/13/2020 0541 Gross per 24 hour  Intake 1074.63 ml  Output --  Net 1074.63 ml   Filed Weights   05/12/20 1958 05/13/20 0126  Weight: 72.6 kg 73.2 kg    Examination:  General exam: In mild respiratory distress due to  cough and wheezing  HEENT:PERRL,Oral mucosa moist, Ear/Nose normal on gross exam Respiratory system: Bilateral expiratory wheezes  cardiovascular system: Sinus tachycardia, no JVD, murmurs, rubs, gallops or clicks. No pedal edema. Gastrointestinal system: Abdomen is nondistended, soft and nontender. No organomegaly or masses felt. Normal bowel sounds heard. Central nervous system: Alert and oriented. No focal neurological deficits. Extremities: No edema, no clubbing ,no cyanosis, distal peripheral pulses palpable. Skin: No rashes, lesions or ulcers,no icterus ,no pallor    Data Reviewed: I have personally reviewed following labs and imaging studies  CBC: Recent Labs   Lab 05/12/20 2002 05/13/20 0249  WBC 8.4 8.8  NEUTROABS 3.0 8.1*  HGB 14.6 13.5  HCT 43.9 41.4  MCV 92.6 94.7  PLT 245 193   Basic Metabolic Panel: Recent Labs  Lab 05/12/20 2002 05/13/20 0249  NA 142 138  K 4.0 3.0*  CL 107 106  CO2 26 19*  GLUCOSE 110* 273*  BUN 13 13  CREATININE 0.98 1.00  CALCIUM 9.1 8.1*  MG  --  2.3   GFR: Estimated Creatinine Clearance: 61.7 mL/min (by C-G formula based on SCr of 1 mg/dL). Liver Function Tests: No results for input(s): AST, ALT, ALKPHOS, BILITOT, PROT, ALBUMIN in the last 168 hours. No results for input(s): LIPASE, AMYLASE in the last 168 hours. No results for input(s): AMMONIA in the last 168 hours. Coagulation Profile: No results for input(s): INR, PROTIME in the last 168 hours. Cardiac Enzymes: No results for input(s): CKTOTAL, CKMB, CKMBINDEX, TROPONINI in the last 168 hours. BNP (last 3 results) No results for input(s): PROBNP in the last 8760 hours. HbA1C: No results for input(s): HGBA1C in the last 72 hours. CBG: No results for input(s): GLUCAP in the last 168 hours. Lipid Profile: No results for input(s): CHOL, HDL, LDLCALC, TRIG, CHOLHDL, LDLDIRECT in the last 72 hours. Thyroid Function Tests: No results for input(s): TSH, T4TOTAL, FREET4, T3FREE, THYROIDAB in the last 72 hours. Anemia Panel: No results for input(s): VITAMINB12, FOLATE, FERRITIN, TIBC, IRON, RETICCTPCT in the last 72 hours. Sepsis Labs: No results for input(s): PROCALCITON, LATICACIDVEN in the last 168 hours.  Recent Results (from the past 240 hour(s))  Respiratory Panel by RT PCR (Flu A&B, Covid) - Nasopharyngeal Swab     Status: None   Collection Time: 05/12/20  8:02 PM   Specimen: Nasopharyngeal Swab  Result Value Ref Range Status   SARS Coronavirus 2 by RT PCR NEGATIVE NEGATIVE Final    Comment: (NOTE) SARS-CoV-2 target nucleic acids are NOT DETECTED.  The SARS-CoV-2 RNA is generally detectable in upper respiratoy specimens during the  acute phase of infection. The lowest concentration of SARS-CoV-2 viral copies this assay can detect is 131 copies/mL. A negative result does not preclude SARS-Cov-2 infection and should not be used as the sole basis for treatment or other patient management decisions. A negative result may occur with  improper specimen collection/handling, submission of specimen other than nasopharyngeal swab, presence of viral mutation(s) within the areas targeted by this assay, and inadequate number of viral copies (<131 copies/mL). A negative result must be combined with clinical observations, patient history, and epidemiological information. The expected result is Negative.  Fact Sheet for Patients:  https://www.moore.com/  Fact Sheet for Healthcare Providers:  https://www.young.biz/  This test is no t yet approved or cleared by the [place] FDA and  has been authorized for detection and/or diagnosis of SARS-CoV-2 by FDA under an Emergency Use Authorization (EUA). This EUA will remain  in effect (meaning this  test can be used) for the duration of the COVID-19 declaration under Section 564(b)(1) of the Act, 21 U.S.C. section 360bbb-3(b)(1), unless the authorization is terminated or revoked sooner.     Influenza A by PCR NEGATIVE NEGATIVE Final   Influenza B by PCR NEGATIVE NEGATIVE Final    Comment: (NOTE) The Xpert Xpress SARS-CoV-2/FLU/RSV assay is intended as an aid in  the diagnosis of influenza from Nasopharyngeal swab specimens and  should not be used as a sole basis for treatment. Nasal washings and  aspirates are unacceptable for Xpert Xpress SARS-CoV-2/FLU/RSV  testing.  Fact Sheet for Patients: https://www.moore.com/  Fact Sheet for Healthcare Providers: https://www.young.biz/  This test is not yet approved or cleared by the [place] FDA and  has been authorized for detection and/or diagnosis  of SARS-CoV-2 by  FDA under an Emergency Use Authorization (EUA). This EUA will remain  in effect (meaning this test can be used) for the duration of the  Covid-19 declaration under Section 564(b)(1) of the Act, 21  U.S.C. section 360bbb-3(b)(1), unless the authorization is  terminated or revoked. Performed at [facility], [address] [place], [place] [address]   MRSA PCR Screening     Status: None   Collection Time: 05/13/20  1:16 AM   Specimen: Nasopharyngeal  Result Value Ref Range Status   MRSA by PCR NEGATIVE NEGATIVE Final    Comment:        The GeneXpert MRSA Assay (FDA approved for NASAL specimens only), is one component of a comprehensive MRSA colonization surveillance program. It is not intended to diagnose MRSA infection nor to guide or monitor treatment for MRSA infections. Performed at [facility], [address] [place], [place] [address]          Radiology Studies: DG Chest Port 1 View  Result Date: 05/12/2020 CLINICAL DATA:  Shortness of breath EXAM: PORTABLE CHEST 1 VIEW COMPARISON:  03/15/2020 FINDINGS: The heart size and mediastinal contours are within normal limits. Both lungs are clear. The visualized skeletal structures are unremarkable. IMPRESSION: No active disease. Electronically Signed   By: Alcide Clever M.D.   On: 05/12/2020 20:12        Scheduled Meds: . chlorhexidine  15 mL Mouth Rinse BID  . Chlorhexidine Gluconate Cloth  6 each Topical Daily  . enoxaparin (LOVENOX) injection  40 mg Subcutaneous Daily  . fluticasone furoate-vilanterol  1 puff Inhalation Daily  . [START ON 05/14/2020] influenza vac split quadrivalent PF  0.5 mL Intramuscular Tomorrow-1000  . ipratropium-albuterol  3 mL Nebulization Q6H  . mouth rinse  15 mL Mouth Rinse q12n4p  . [START ON 05/14/2020] pneumococcal 23 valent vaccine  0.5 mL Intramuscular Tomorrow-1000  . potassium chloride  40 mEq Oral Q4H  . predniSONE  40 mg Oral Q  breakfast  . sodium chloride flush  3 mL Intravenous Q12H   Continuous Infusions: . sodium chloride Stopped (05/13/20 0100)     LOS: 0 days    Time spent: 35 mins.More than 50% of that time was spent in counseling and/or coordination of care.      Burnadette Pop, MD [facility] P10/24/2021, 7:38 AM

## 2020-05-13 NOTE — ED Notes (Signed)
Patient transferred to another facility with CAT, 10mg /hr Albuterol/1mg  Atrovent. Report given to RT at other facility.

## 2020-05-13 NOTE — Progress Notes (Addendum)
CRITICAL VALUE ALERT  Critical Value:  Potassium 3.0  Date & Time Notied:  05-13-20 @ [phone]  Provider Notified: Dr. Frederick Peers  Orders Received/Actions taken: see new orders

## 2020-05-13 NOTE — H&P (Signed)
History and Physical    Alailah Safley  DEY:[id number]  DOB: Oct 23, 1970  DOA: 05/12/2020  PCP: Patient, No Pcp Per Patient coming from: home  Chief Complaint: SOB/wheezing  HPI:  Ms. Men is a 49 yo CF with PMH moderate persistent asthma, HTN who presented to [facility] with worsening shortness of breath and wheezing at home.  She states that she has been waking up several times during the night for the last few weeks with significant shortness of breath and difficulty breathing.  She finally presented to [facility] for treatment.  She also states that she had been on Advair previously with good control at home.  She has been off for approximately 8 months after running out of insurance at that time.  She states that she would like to get back on it if able. She has been using her home albuterol inhaler and nebulizer several times throughout the day with minimal relief.  She has been coughing significantly however not producing sputum.  She denies fevers, chills, sweats. CXR on work-up was clear with no infiltrates or evidence of effusions/pulmonary edema. Chemistries and CBC were normal with no remarkable findings. COVID-19 and flu swab was negative. She was treated with magnesium sulfate, Solu-Medrol, nebulizers, and started on continuous albuterol neb prior to transfer. She also required initiation of oxygen up to 4 L. Due to her need for continuous neb, oxygen, and severity of exacerbation she was accepted to a stepdown bed upon transfer.  Hospitalization is continued for ongoing asthma exacerbation treatment.   I have personally briefly reviewed patient's old medical records in [facility] and discussed patient with the ER provider when appropriate/indicated.  Assessment/Plan: * Acute asthma exacerbation -Patient denies prior history of requiring inpatient hospitalization.  Typically treated in ER and able to go home.  This exacerbation, symptoms have lingered and worsened. -Continue on  scheduled and as needed duo nebs -Continue prednisone 40 mg daily.  She states good response to steroid in the past -Resume inhaled corticosteroid (she was on Advair at home previously and wishes to resume it at discharge) -Tussionex as needed for cough -Hold off on further continuous albuterol nebulizer for now.  If worsens may reconsider -Currently on 4 L oxygen, wean as able -If remains stable after monitoring overnight in stepdown, can likely transfer out of stepdown unit in the morning  Moderate persistent asthma -Patient requests to be restarted on Advair at discharge -See asthma exacerbation treatment  Hypertension -Await med rec, then resume home regimen; ?Losartan allergy (drowsiness) -Will order labetalol or hydralazine for PRN use for now     Code Status: Full DVT Prophylaxis: Lovenox Anticipated disposition is to home in 1-2 days  History: Past Medical History:  Diagnosis Date  . Asthma   . Hypertension     Past Surgical History:  Procedure Laterality Date  . CESAREAN SECTION       reports that she has been smoking. She has never used smokeless tobacco. She reports current alcohol use. She reports that she does not use drugs.  Allergies  Allergen Reactions  . Pollen Extract Anaphylaxis and Itching  . Dust Mite Extract Itching  . Losartan     drowsiness    Family History  Problem Relation Age of Onset  . Diabetes Mother   . Hypothyroidism Mother   . Diabetes Father    Home Medications: Prior to Admission medications   Medication Sig Start Date End Date Taking? Authorizing Provider  albuterol (PROVENTIL) (2.5 MG/3ML) 0.083% nebulizer solution Take  3 mLs (2.5 mg total) by nebulization every 6 (six) hours as needed for wheezing or shortness of breath. 03/15/20   Curatolo, Adam, DO  albuterol (VENTOLIN HFA) 108 (90 Base) MCG/ACT inhaler Inhale into the lungs every 6 (six) hours as needed for wheezing or shortness of breath.    [provider]   fluticasone-salmeterol (ADVAIR HFA) 115-21 MCG/ACT inhaler Inhale 2 puffs into the lungs 2 (two) times daily.    [provider]  loratadine (CLARITIN) 10 MG tablet Take 10 mg by mouth daily.    [provider]  losartan (COZAAR) 25 MG tablet Take 25 mg by mouth daily.    [provider]    Review of Systems:  Pertinent items noted in HPI and remainder of comprehensive ROS otherwise negative.  Physical Exam: Vitals:   05/13/20 0024 05/13/20 0030 05/13/20 0045 05/13/20 0126  BP:    (!) 154/71  Pulse:  (!) 103 (!) 105 (!) 119  Resp:  19 16 16   Temp:    97.7 F (36.5 C)  TempSrc:    Oral  SpO2: 95% 95% 97% 98%  Weight:    73.2 kg  Height:    5' 1.5" (1.562 m)   General appearance: Pleasant adult woman laying in bed in no obvious distress but does appear mildly short of breath Head: Normocephalic, without obvious abnormality, atraumatic Eyes: EOMI Lungs: diffuse coarse breath sounds bilaterally with expiratory wheezing throughout Heart: S1, S2 normal and Tachycardic, regular rhythm Abdomen: normal findings: bowel sounds normal and soft, non-tender Extremities: no edema Skin: mobility and turgor normal Neurologic: Grossly normal  Labs on Admission:  I have personally reviewed following labs and imaging studies Results for orders placed or performed during the hospital encounter of 05/12/20 (from the past 24 hour(s))  Respiratory Panel by RT PCR (Flu A&B, Covid) - Nasopharyngeal Swab     Status: None   Collection Time: 05/12/20  8:02 PM   Specimen: Nasopharyngeal Swab  Result Value Ref Range   SARS Coronavirus 2 by RT PCR NEGATIVE NEGATIVE   Influenza A by PCR NEGATIVE NEGATIVE   Influenza B by PCR NEGATIVE NEGATIVE  Basic metabolic panel     Status: Abnormal   Collection Time: 05/12/20  8:02 PM  Result Value Ref Range   Sodium 142 135 - 145 mmol/L   Potassium 4.0 3.5 - 5.1 mmol/L   Chloride 107 98 - 111 mmol/L   CO2 26 22 - 32 mmol/L   Glucose,  Bld 110 (H) 70 - 99 mg/dL   BUN 13 6 - 20 mg/dL   Creatinine, Ser 05/14/20 0.44 - 1.00 mg/dL   Calcium 9.1 8.9 - 3.33 mg/dL   GFR, Estimated 54.5 >62 mL/min   Anion gap 9 5 - 15  CBC with Differential     Status: Abnormal   Collection Time: 05/12/20  8:02 PM  Result Value Ref Range   WBC 8.4 4.0 - 10.5 K/uL   RBC 4.74 3.87 - 5.11 MIL/uL   Hemoglobin 14.6 12.0 - 15.0 g/dL   HCT 05/14/20 36 - 46 %   MCV 92.6 80.0 - 100.0 fL   MCH 30.8 26.0 - 34.0 pg   MCHC 33.3 30.0 - 36.0 g/dL   RDW 38.9 37.3 - 42.8 %   Platelets 245 150 - 400 K/uL   nRBC 0.0 0.0 - 0.2 %   Neutrophils Relative % 36 %   Neutro Abs 3.0 1.7 - 7.7 K/uL   Lymphocytes Relative 35 %  Lymphs Abs 3.0 0.7 - 4.0 K/uL   Monocytes Relative 8 %   Monocytes Absolute 0.7 0.1 - 1.0 K/uL   Eosinophils Relative 20 %   Eosinophils Absolute 1.7 (H) 0.0 - 0.5 K/uL   Basophils Relative 1 %   Basophils Absolute 0.0 0.0 - 0.1 K/uL   Immature Granulocytes 0 %   Abs Immature Granulocytes 0.02 0.00 - 0.07 K/uL     Radiological Exams on Admission: DG Chest Port 1 View  Result Date: 05/12/2020 CLINICAL DATA:  Shortness of breath EXAM: PORTABLE CHEST 1 VIEW COMPARISON:  03/15/2020 FINDINGS: The heart size and mediastinal contours are within normal limits. Both lungs are clear. The visualized skeletal structures are unremarkable. IMPRESSION: No active disease. Electronically Signed   By: Alcide Clever M.D.   On: 05/12/2020 20:12   DG Chest [id number] [facility]  Final Result      Consults called:  none   EKG: Independently reviewed. Degraded by motion but sinus tach   Lewie Chamber, MD [facility] 05/13/2020, 2:12 AM

## 2020-05-13 NOTE — Hospital Course (Addendum)
Laura Burns is a 48 yo CF with PMH moderate persistent asthma, HTN who presented to [facility] with worsening shortness of breath and wheezing at home.  She states that she has been waking up several times during the night for the last few weeks with significant shortness of breath and difficulty breathing.  She finally presented to [facility] for treatment.  She also states that she had been on Advair previously with good control at home.  She has been off for approximately 8 months after running out of insurance at that time.  She states that she would like to get back on it if able. She has been using her home albuterol inhaler and nebulizer several times throughout the day with minimal relief.  She has been coughing significantly however not producing sputum.  She denies fevers, chills, sweats. CXR on work-up was clear with no infiltrates or evidence of effusions/pulmonary edema. Chemistries and CBC were normal with no remarkable findings. COVID-19 and flu swab was negative. She was treated with magnesium sulfate, Solu-Medrol, nebulizers, and started on continuous albuterol neb prior to transfer. She also required initiation of oxygen up to 4 L. Due to her need for continuous neb, oxygen, and severity of exacerbation she was accepted to a stepdown bed upon transfer.  Hospitalization is continued for ongoing asthma exacerbation treatment.

## 2020-05-13 NOTE — Assessment & Plan Note (Signed)
-  Patient denies prior history of requiring inpatient hospitalization.  Typically treated in ER and able to go home.  This exacerbation, symptoms have lingered and worsened. -Continue on scheduled and as needed duo nebs -Continue prednisone 40 mg daily.  She states good response to steroid in the past -Resume inhaled corticosteroid (she was on Advair at home previously and wishes to resume it at discharge) -Tussionex as needed for cough -Hold off on further continuous albuterol nebulizer for now.  If worsens may reconsider -Currently on 4 L oxygen, wean as able -If remains stable after monitoring overnight in stepdown, can likely transfer out of stepdown unit in the morning

## 2020-05-13 NOTE — Plan of Care (Signed)
New admission from [facility] for asthma exacerbation. Patient is alert and oriented, able to make needs known. Patient currently on 4L [place] with elevated HR that may be related to albuterol given prior to admission while in ED. Patient c/o pain with coughing and anxiety, Tussinex administered as prescribed. Continuing to monitor for needs.   Problem: Education: Goal: Knowledge of General Education information will improve Description: Including pain rating scale, medication(s)/side effects and non-pharmacologic comfort measures Outcome: Progressing   Problem: Health Behavior/Discharge Planning: Goal: Ability to manage health-related needs will improve Outcome: Progressing   Problem: Clinical Measurements: Goal: Ability to maintain clinical measurements within normal limits will improve Outcome: Progressing Goal: Will remain free from infection Outcome: Progressing Goal: Diagnostic test results will improve Outcome: Progressing Goal: Respiratory complications will improve Outcome: Progressing Goal: Cardiovascular complication will be avoided Outcome: Progressing   Problem: Activity: Goal: Risk for activity intolerance will decrease Outcome: Progressing   Problem: Nutrition: Goal: Adequate nutrition will be maintained Outcome: Progressing   Problem: Coping: Goal: Level of anxiety will decrease Outcome: Progressing   Problem: Elimination: Goal: Will not experience complications related to bowel motility Outcome: Progressing Goal: Will not experience complications related to urinary retention Outcome: Progressing   Problem: Pain Managment: Goal: General experience of comfort will improve Outcome: Progressing   Problem: Safety: Goal: Ability to remain free from injury will improve Outcome: Progressing   Problem: Skin Integrity: Goal: Risk for impaired skin integrity will decrease Outcome: Progressing

## 2020-05-13 NOTE — Assessment & Plan Note (Addendum)
-  Await med rec, then resume home regimen; ?Losartan allergy (drowsiness) -Will order labetalol or hydralazine for PRN use for now

## 2020-05-14 LAB — BASIC METABOLIC PANEL
Anion gap: 6 (ref 5–15)
BUN: 20 mg/dL (ref 6–20)
CO2: 21 mmol/L — ABNORMAL LOW (ref 22–32)
Calcium: 8.9 mg/dL (ref 8.9–10.3)
Chloride: 110 mmol/L (ref 98–111)
Creatinine, Ser: 0.76 mg/dL (ref 0.44–1.00)
GFR, Estimated: >60 mL/min (ref >60)
Glucose, Bld: 150 mg/dL — ABNORMAL HIGH (ref 70–99)
Potassium: 4.9 mmol/L (ref 3.5–5.1)
Sodium: 137 mmol/L (ref 135–145)

## 2020-05-14 NOTE — Progress Notes (Signed)
PROGRESS NOTE    Laura Burns  NLZ:[id number] DOB: [birth year] DOA: 05/12/2020 PCP: Patient, No Pcp Per   Chief Complain: Shortness of breath, wheezing  Brief Narrative: 49 year old female with history of moderate persistent asthma, hypertension who presents to med [facility] with worsening shortness of breath and wheezing at home.  She was taking Advair before but not taking now because she lost her insurance.  On presentation she was hemodynamically stable but tachycardic.  Chest x-ray did not show any infiltrates.  Lab works were normal.  Covid screening test was negative.  She required oxygenation for maintenance of saturation.  She is not on home oxygen.  She was treated with magnesium sulfate, Solu-Medrol, nebulization treatment.  Patient was admitted for the management of asthma exacerbation.  Assessment & Plan:   Principal Problem:   Acute asthma exacerbation Active Problems:   Moderate persistent asthma   Hypertension   Acute asthma exacerbation: Has history of moderate persistent asthma.  Presented with cough, wheezing, shortness of breath from home.  Currently not using any inhalers at home.  Started on bronchodilators, steroids.  Continue current medications.  She is on room air this mrng.  Not on oxygen at home.  We will try to wean the oxygen to off.  Chest x-ray on presentation did not show any pneumonia.  Hypertension: Currently blood pressure stable.  Monitor blood pressure.  Takes losartan at home  Hyperlipidemia: On Lipitor at home.  Hypokalemia:e Supplemented and corrected.  Headache: Continue Tylenol/ibuprofen  We will try to arrange follow-up with a PCP, pulmonology as an outpatient on discharge.         DVT prophylaxis: Lovenox Code Status: Full Family Communication: None at bed side Status is: Inpatient  Remains inpatient appropriate because:Hemodynamically unstable and Inpatient level of care appropriate due to severity of illness   Dispo:  The patient is from: Home              Anticipated d/c is to: Home              Anticipated d/c date is: 1 day              Patient currently is not medically stable to d/c.      Consultants: None  Procedures: None  Antimicrobials:  Anti-infectives (From admission, onward)   None      Subjective: Patient seen and examined at the bedside this morning.  She was on room air saturation around 90%.  She was still in sinus tachycardia.  Still has some mild bilateral expiratory wheezes decided to continue IV Solu-Medrol and may be  to prednisone before discharge tomorrow.  Objective: Vitals:   05/14/20 0200 05/14/20 0300 05/14/20 0400 05/14/20 0500  BP:   128/69   Pulse: 65 66 64 73  Resp: 12 13 16 14   Temp:   98.2 F (36.8 C)   TempSrc:   Oral   SpO2: 94% 96% 92% 94%  Weight:      Height:        Intake/Output Summary (Last 24 hours) at 05/14/2020 0732 Last data filed at 05/13/2020 1800 Gross per 24 hour  Intake 0 ml  Output --  Net 0 ml   Filed Weights   05/12/20 1958 05/13/20 0126  Weight: 72.6 kg 73.2 kg    Examination:  General exam: Appears calm and comfortable ,Not in distress,average built HEENT:PERRL,Oral mucosa moist, Ear/Nose normal on gross exam Respiratory system: Bilateral mild expiratory wheezes Cardiovascular system: S1 & S2 heard,  RRR. No JVD, murmurs, rubs, gallops or clicks. Gastrointestinal system: Abdomen is nondistended, soft and nontender. No organomegaly or masses felt. Normal bowel sounds heard. Central nervous system: Alert and oriented. No focal neurological deficits. Extremities: No edema, no clubbing ,no cyanosis Skin: No rashes, lesions or ulcers,no icterus ,no pallor    Data Reviewed: I have personally reviewed following labs and imaging studies  CBC: Recent Labs  Lab 05/12/20 2002 05/13/20 0249  WBC 8.4 8.8  NEUTROABS 3.0 8.1*  HGB 14.6 13.5  HCT 43.9 41.4  MCV 92.6 94.7  PLT 245 193   Basic Metabolic Panel: Recent  Labs  Lab 05/12/20 2002 05/13/20 0249 05/14/20 0321  NA 142 138 137  K 4.0 3.0* 4.9  CL 107 106 110  CO2 26 19* 21*  GLUCOSE 110* 273* 150*  BUN 13 13 20   CREATININE 0.98 1.00 0.76  CALCIUM 9.1 8.1* 8.9  MG  --  2.3  --    GFR: Estimated Creatinine Clearance: 77.1 mL/min (by C-G formula based on SCr of 0.76 mg/dL). Liver Function Tests: No results for input(s): AST, ALT, ALKPHOS, BILITOT, PROT, ALBUMIN in the last 168 hours. No results for input(s): LIPASE, AMYLASE in the last 168 hours. No results for input(s): AMMONIA in the last 168 hours. Coagulation Profile: No results for input(s): INR, PROTIME in the last 168 hours. Cardiac Enzymes: No results for input(s): CKTOTAL, CKMB, CKMBINDEX, TROPONINI in the last 168 hours. BNP (last 3 results) No results for input(s): PROBNP in the last 8760 hours. HbA1C: No results for input(s): HGBA1C in the last 72 hours. CBG: No results for input(s): GLUCAP in the last 168 hours. Lipid Profile: No results for input(s): CHOL, HDL, LDLCALC, TRIG, CHOLHDL, LDLDIRECT in the last 72 hours. Thyroid Function Tests: No results for input(s): TSH, T4TOTAL, FREET4, T3FREE, THYROIDAB in the last 72 hours. Anemia Panel: No results for input(s): VITAMINB12, FOLATE, FERRITIN, TIBC, IRON, RETICCTPCT in the last 72 hours. Sepsis Labs: No results for input(s): PROCALCITON, LATICACIDVEN in the last 168 hours.  Recent Results (from the past 240 hour(s))  Respiratory Panel by RT PCR (Flu A&B, Covid) - Nasopharyngeal Swab     Status: None   Collection Time: 05/12/20  8:02 PM   Specimen: Nasopharyngeal Swab  Result Value Ref Range Status   SARS Coronavirus 2 by RT PCR NEGATIVE NEGATIVE Final    Comment: (NOTE) SARS-CoV-2 target nucleic acids are NOT DETECTED.  The SARS-CoV-2 RNA is generally detectable in upper respiratoy specimens during the acute phase of infection. The lowest concentration of SARS-CoV-2 viral copies this assay can detect is 131  copies/mL. A negative result does not preclude SARS-Cov-2 infection and should not be used as the sole basis for treatment or other patient management decisions. A negative result may occur with  improper specimen collection/handling, submission of specimen other than nasopharyngeal swab, presence of viral mutation(s) within the areas targeted by this assay, and inadequate number of viral copies (<131 copies/mL). A negative result must be combined with clinical observations, patient history, and epidemiological information. The expected result is Negative.  Fact Sheet for Patients:  05/14/20  Fact Sheet for Healthcare Providers:  https://www.moore.com/  This test is no t yet approved or cleared by the https://www.young.biz/ FDA and  has been authorized for detection and/or diagnosis of SARS-CoV-2 by FDA under an Emergency Use Authorization (EUA). This EUA will remain  in effect (meaning this test can be used) for the duration of the COVID-19 declaration under Section 564(b)(1) of  the Act, 21 U.S.C. section 360bbb-3(b)(1), unless the authorization is terminated or revoked sooner.     Influenza A by PCR NEGATIVE NEGATIVE Final   Influenza B by PCR NEGATIVE NEGATIVE Final    Comment: (NOTE) The Xpert Xpress SARS-CoV-2/FLU/RSV assay is intended as an aid in  the diagnosis of influenza from Nasopharyngeal swab specimens and  should not be used as a sole basis for treatment. Nasal washings and  aspirates are unacceptable for Xpert Xpress SARS-CoV-2/FLU/RSV  testing.  Fact Sheet for Patients: https://www.moore.com/  Fact Sheet for Healthcare Providers: https://www.young.biz/  This test is not yet approved or cleared by the [place] FDA and  has been authorized for detection and/or diagnosis of SARS-CoV-2 by  FDA under an Emergency Use Authorization (EUA). This EUA will remain  in effect (meaning  this test can be used) for the duration of the  Covid-19 declaration under Section 564(b)(1) of the Act, 21  U.S.C. section 360bbb-3(b)(1), unless the authorization is  terminated or revoked. Performed at [facility], [address] [place], [place] [address]   MRSA PCR Screening     Status: None   Collection Time: 05/13/20  1:16 AM   Specimen: Nasopharyngeal  Result Value Ref Range Status   MRSA by PCR NEGATIVE NEGATIVE Final    Comment:        The GeneXpert MRSA Assay (FDA approved for NASAL specimens only), is one component of a comprehensive MRSA colonization surveillance program. It is not intended to diagnose MRSA infection nor to guide or monitor treatment for MRSA infections. Performed at [facility], [address], [place], [place] [address]          Radiology Studies: DG Chest Port 1 View  Result Date: 05/12/2020 CLINICAL DATA:  Shortness of breath EXAM: PORTABLE CHEST 1 VIEW COMPARISON:  03/15/2020 FINDINGS: The heart size and mediastinal contours are within normal limits. Both lungs are clear. The visualized skeletal structures are unremarkable. IMPRESSION: No active disease. Electronically Signed   By: Alcide Clever M.D.   On: 05/12/2020 20:12        Scheduled Meds: . Chlorhexidine Gluconate Cloth  6 each Topical Daily  . enoxaparin (LOVENOX) injection  40 mg Subcutaneous Daily  . fluticasone furoate-vilanterol  1 puff Inhalation Daily  . influenza vac split quadrivalent PF  0.5 mL Intramuscular Tomorrow-1000  . ipratropium-albuterol  3 mL Nebulization Q6H  . mouth rinse  15 mL Mouth Rinse BID  . methylPREDNISolone (SOLU-MEDROL) injection  40 mg Intravenous Q12H  . pneumococcal 23 valent vaccine  0.5 mL Intramuscular Tomorrow-1000  . sodium chloride flush  3 mL Intravenous Q12H   Continuous Infusions: . sodium chloride Stopped (05/13/20 0100)     LOS: 1 day    Time spent: 35 mins.More than 50% of that time was  spent in counseling and/or coordination of care.      Burnadette Pop, MD [facility] P10/25/2021, 7:32 AM

## 2020-05-14 NOTE — Progress Notes (Addendum)
Patient is admitted in the unit @ 1800hr via wheelchair, pt. Is alert and oriented, in no acute ditress. Vital sign taken and recorded. Pt. Was oriented to the room, needs attended to.

## 2020-05-14 NOTE — Plan of Care (Signed)
Alert and oriented. Denies any pain and SOB. Productive cough noted, oral yankeur at bedside. Neb treatment managed by RT. Call bell within reach.  Problem: Education: Goal: Knowledge of General Education information will improve Description: Including pain rating scale, medication(s)/side effects and non-pharmacologic comfort measures Outcome: Progressing   Problem: Health Behavior/Discharge Planning: Goal: Ability to manage health-related needs will improve Outcome: Progressing   Problem: Clinical Measurements: Goal: Ability to maintain clinical measurements within normal limits will improve Outcome: Progressing Goal: Will remain free from infection Outcome: Progressing Goal: Diagnostic test results will improve Outcome: Progressing Goal: Respiratory complications will improve Outcome: Progressing Goal: Cardiovascular complication will be avoided Outcome: Progressing   Problem: Activity: Goal: Risk for activity intolerance will decrease Outcome: Progressing   Problem: Nutrition: Goal: Adequate nutrition will be maintained Outcome: Progressing   Problem: Coping: Goal: Level of anxiety will decrease Outcome: Progressing   Problem: Elimination: Goal: Will not experience complications related to bowel motility Outcome: Progressing Goal: Will not experience complications related to urinary retention Outcome: Progressing   Problem: Pain Managment: Goal: General experience of comfort will improve Outcome: Progressing   Problem: Safety: Goal: Ability to remain free from injury will improve Outcome: Progressing   Problem: Skin Integrity: Goal: Risk for impaired skin integrity will decrease Outcome: Progressing

## 2020-05-14 NOTE — TOC Initial Note (Addendum)
Transition of Care [facility]) - Initial/Assessment Note    Patient Details  Name: Laura Burns MRN: [id number] Date of Birth: [birth year]  [facility] CM/SW Contact:    Golda Acre, RN Phone Number: 05/14/2020, 8:58 AM  Clinical Narrative:                 49 year old female with history of moderate persistent asthma, hypertension who presents to med [facility] with worsening shortness of breath and wheezing at home.  She was taking Advair before but not taking now because she lost her insurance.  On presentation she was hemodynamically stable but tachycardic.  Chest x-ray did not show any infiltrates.  Lab works were normal.  Covid screening test was negative.  She required oxygenation for maintenance of saturation.  She is not on home oxygen.  She was treated with magnesium sulfate, Solu-Medrol, nebulization treatment.  Patient was admitted for the management of asthma exacerbation. following for progression and toc needs Follow up appointment made and copy too patient. [facility]   [phone] [address] [place] [address]    Next Steps: Go on 05/25/2020    Instructions: appointment is with Delfin Gant at 11 am. Please arrive at 10:45 am to do paper work      Last edited by: Shon Baton, RN (05/14/2020 9:09 AM)    Expected Discharge Plan: Home/Self Care Barriers to Discharge: Barriers Unresolved (comment)   Patient Goals and CMS Choice Patient states their goals for this hospitalization and ongoing recovery are:: to go home CMS Medicare.gov Compare Post Acute Care list provided to:: Patient    Expected Discharge Plan and Services Expected Discharge Plan: Home/Self Care   Discharge Planning Services: CM Consult   Living arrangements for the past 2 months: Single Family Home                                      Prior Living Arrangements/Services Living arrangements for the past 2  months: Single Family Home Lives with:: Self Patient language and need for interpreter reviewed:: Yes Do you feel safe going back to the place where you live?: Yes      Need for Family Participation in Patient Care: Yes (Comment) Care giver support system in place?: Yes (comment)   Criminal Activity/Legal Involvement Pertinent to Current Situation/Hospitalization: No - Comment as needed  Activities of Daily Living Home Assistive Devices/Equipment: None ADL Screening (condition at time of admission) Patient's cognitive ability adequate to safely complete daily activities?: Yes Is the patient deaf or have difficulty hearing?: No Does the patient have difficulty seeing, even when wearing glasses/contacts?: No Does the patient have difficulty concentrating, remembering, or making decisions?: No Patient able to express need for assistance with ADLs?: Yes Does the patient have difficulty dressing or bathing?: No Independently performs ADLs?: Yes (appropriate for developmental age) Communication: Independent Dressing (OT): Independent Grooming: Independent Feeding: Independent Bathing: Independent Toileting: Independent In/Out Bed: Independent Walks in Home: Independent Does the patient have difficulty walking or climbing stairs?: No Weakness of Legs: None Weakness of Arms/Hands: None  Permission Sought/Granted                  Emotional Assessment Appearance:: Appears stated age Attitude/Demeanor/Rapport: Engaged Affect (typically observed): Calm Orientation: : Oriented to Self, Oriented to Place, Oriented to  Time, Oriented to Situation Alcohol / Substance Use: Not  Applicable Psych Involvement: No (comment)  Admission diagnosis:  Acute asthma exacerbation [J45.901] Exacerbation of asthma, unspecified asthma severity, unspecified whether persistent [J45.901] Patient Active Problem List   Diagnosis Date Noted  . Moderate persistent asthma 05/13/2020  . Hypertension   .  Acute asthma exacerbation 05/12/2020   PCP:  Patient, No Pcp Per Pharmacy:   [id number] - [place], [place] - [address] [place] [address] Phone: [phone] Fax: [phone]     Social Determinants of Health (SDOH) Interventions    Readmission Risk Interventions No flowsheet data found.

## 2020-05-15 ENCOUNTER — Other Ambulatory Visit (HOSPITAL_COMMUNITY): Payer: Self-pay | Admitting: Internal Medicine

## 2020-05-15 MED ORDER — GUAIFENESIN-DM 100-10 MG/5ML PO SYRP: 5.0000 mL | ORAL_SOLUTION | ORAL | 1 refills | Status: DC | PRN

## 2020-05-15 MED ORDER — ALBUTEROL SULFATE (2.5 MG/3ML) 0.083% IN NEBU: 2.5000 mg | INHALATION_SOLUTION | Freq: Four times a day (QID) | RESPIRATORY_TRACT | 3 refills | Status: DC | PRN

## 2020-05-15 MED ORDER — MONTELUKAST SODIUM 10 MG PO TABS: 10.0000 mg | ORAL_TABLET | Freq: Every day | ORAL | 2 refills | Status: DC

## 2020-05-15 MED ORDER — FLUTICASONE-SALMETEROL 115-21 MCG/ACT IN AERO: 2.0000 | INHALATION_SPRAY | Freq: Two times a day (BID) | RESPIRATORY_TRACT | 2 refills | Status: DC

## 2020-05-15 MED ORDER — PREDNISONE 20 MG PO TABS: 40.0000 mg | ORAL_TABLET | Freq: Every day | ORAL | 0 refills | Status: DC

## 2020-05-15 MED ORDER — ALBUTEROL SULFATE HFA 108 (90 BASE) MCG/ACT IN AERS: 2.0000 | INHALATION_SPRAY | Freq: Four times a day (QID) | RESPIRATORY_TRACT | 1 refills | Status: DC | PRN

## 2020-05-15 MED FILL — predniSONE 20 MG TABS: 20 | 5 days supply | Qty: 10 | Fill #0

## 2020-05-15 MED FILL — ADVAIR HFA 115-21 MCG INH: 115-21 | 30 days supply | Qty: 12 | Fill #0

## 2020-05-15 MED FILL — ALBUTEROL SUL 2.5 MG/3 ML S: (2.5 MG/3ML | 25 days supply | Qty: 75 | Fill #0

## 2020-05-15 MED FILL — ALBUTEROL SULFATE HFA 108 (: 108 (90 BAS | 25 days supply | Qty: 9 | Fill #0

## 2020-05-15 MED FILL — MONTELUKAST SOD 10 MG TAB: 10 | 30 days supply | Qty: 30 | Fill #0

## 2020-05-15 NOTE — Discharge Summary (Signed)
Physician Discharge Summary  Laura Burns TGY:[id number] DOB: Nov 28, 1970 DOA: 05/12/2020  PCP: Patient, No Pcp Per  Admit date: 05/12/2020 Discharge date: 05/15/2020  Admitted From: Home Disposition:  Home  Discharge Condition:Stable CODE STATUS:FULL Diet recommendation: Heart Healthy  Brief/Interim Summary:  49 year old female with history of moderate persistent asthma, hypertension who presents to med [facility] with worsening shortness of breath and wheezing at home.  She was taking Advair before but not taking now because she lost her insurance.  On presentation she was hemodynamically stable but tachycardic.  Chest x-ray did not show any infiltrates.  Lab works were normal.  Covid screening test was negative.  She required oxygenation for maintenance of saturation.  She is not on home oxygen.  She was treated with magnesium sulfate, Solu-Medrol, nebulization treatment.  Patient was admitted for the management of asthma exacerbation. Her respiratory status has much  improved.  Currently she is on room air.  Wheezings have much improved today.  Hemodynamically stable for discharge today to home.  Following problems were addressed during her hospitalization:   Acute asthma exacerbation: Has history of moderate persistent asthma.  Presented with cough, wheezing, shortness of breath from home.  Currently not using any inhalers at home.  Started on bronchodilators, steroids..  She is on room air this mrng.  Not on oxygen at home.   Chest x-ray on presentation did not show any pneumonia. Started on Advair, Singulair.  Will be discharged on tapering dose of prednisone.  Has nebulizer machine at home.  Hypertension: Currently blood pressure stable.   Takes losartan at home  Hyperlipidemia: On Lipitor at home.  Hypokalemia:e Supplemented and corrected.  Headache: resolved with Tylenol/ibuprofen   Discharge Diagnoses:  Principal Problem:   Acute asthma exacerbation Active  Problems:   Moderate persistent asthma   Hypertension    Discharge Instructions  Discharge Instructions    Diet - low sodium heart healthy   Complete by: As directed    Discharge instructions   Complete by: As directed    1)Please take prescribed medications as instructed. 2)[facility] up [facility] at 05/25/2020 for the follow-up appointment.   Increase activity slowly   Complete by: As directed      Allergies as of 05/15/2020      Reactions   Pollen Extract Anaphylaxis, Itching   Dust Mite Extract Itching      Medication List    TAKE these medications   albuterol 108 (90 Base) MCG/ACT inhaler Commonly known as: VENTOLIN HFA Inhale 2 puffs into the lungs every 6 (six) hours as needed for wheezing or shortness of breath. What changed: how much to take   albuterol (2.5 MG/3ML) 0.083% nebulizer solution Commonly known as: PROVENTIL Take 3 mLs (2.5 mg total) by nebulization every 6 (six) hours as needed for wheezing or shortness of breath. What changed: Another medication with the same name was changed. Make sure you understand how and when to take each.   atorvastatin 20 MG tablet Commonly known as: LIPITOR Take 20 mg by mouth daily.   fluticasone-salmeterol 115-21 MCG/ACT inhaler Commonly known as: ADVAIR HFA Inhale 2 puffs into the lungs 2 (two) times daily.   guaiFENesin-dextromethorphan 100-10 MG/5ML syrup Commonly known as: ROBITUSSIN DM Take 5 mLs by mouth every 4 (four) hours as needed for cough.   losartan 25 MG tablet Commonly known as: COZAAR Take 25 mg by mouth daily.   montelukast 10 MG tablet Commonly known as: Singulair Take 1 tablet (10  mg total) by mouth at bedtime.   predniSONE 20 MG tablet Commonly known as: DELTASONE Take 2 tablets (40 mg total) by mouth daily for 5 days.       Follow-up Information    [facility]. Go on 05/25/2020.   Why: appointment is with Delfin Gant at 11 am.   Please arrive at 10:45 am to do paper work  Also, this is where you will get your prescriptions filled the day of d/c. Contact information: [address] [place] [address] [phone]             Allergies  Allergen Reactions   Pollen Extract Anaphylaxis and Itching   Dust Mite Extract Itching    Consultations: None  Procedures/Studies: DG Chest Port 1 View  Result Date: 05/12/2020 CLINICAL DATA:  Shortness of breath EXAM: PORTABLE CHEST 1 VIEW COMPARISON:  03/15/2020 FINDINGS: The heart size and mediastinal contours are within normal limits. Both lungs are clear. The visualized skeletal structures are unremarkable. IMPRESSION: No active disease. Electronically Signed   By: Alcide Clever M.D.   On: 05/12/2020 20:12       Subjective: Patient seen and examined at the bedside this morning.  Hemodynamically stable for discharge today.   Discharge Exam: Vitals:   05/15/20 0624 05/15/20 0808  BP: (!) 147/108   Pulse: (!) 56   Resp: 15   Temp: 97.7 F (36.5 C)   SpO2: 93% 93%   Vitals:   05/15/20 0212 05/15/20 0348 05/15/20 0624 05/15/20 0808  BP: (!) 154/96  (!) 147/108   Pulse: (!) 56  (!) 56   Resp: 15  15   Temp: 97.8 F (36.6 C)  97.7 F (36.5 C)   TempSrc:   Oral   SpO2: 94% 93% 93% 93%  Weight:      Height:        General: Pt is alert, awake, not in acute distress Cardiovascular: RRR, S1/S2 +, no rubs, no gallops Respiratory: CTA bilaterally, no wheezing, no rhonchi Abdominal: Soft, NT, ND, bowel sounds + Extremities: no edema, no cyanosis    The results of significant diagnostics from this hospitalization (including imaging, microbiology, ancillary and laboratory) are listed below for reference.     Microbiology: Recent Results (from the past 240 hour(s))  Respiratory Panel by RT PCR (Flu A&B, Covid) - Nasopharyngeal Swab     Status: None   Collection Time: 05/12/20  8:02 PM   Specimen: Nasopharyngeal Swab  Result  Value Ref Range Status   SARS Coronavirus 2 by RT PCR NEGATIVE NEGATIVE Final    Comment: (NOTE) SARS-CoV-2 target nucleic acids are NOT DETECTED.  The SARS-CoV-2 RNA is generally detectable in upper respiratoy specimens during the acute phase of infection. The lowest concentration of SARS-CoV-2 viral copies this assay can detect is 131 copies/mL. A negative result does not preclude SARS-Cov-2 infection and should not be used as the sole basis for treatment or other patient management decisions. A negative result may occur with  improper specimen collection/handling, submission of specimen other than nasopharyngeal swab, presence of viral mutation(s) within the areas targeted by this assay, and inadequate number of viral copies (<131 copies/mL). A negative result must be combined with clinical observations, patient history, and epidemiological information. The expected result is Negative.  Fact Sheet for Patients:  https://www.moore.com/  Fact Sheet for Healthcare Providers:  https://www.young.biz/  This test is no t yet approved or cleared by the [place] FDA and  has  been authorized for detection and/or diagnosis of SARS-CoV-2 by FDA under an Emergency Use Authorization (EUA). This EUA will remain  in effect (meaning this test can be used) for the duration of the COVID-19 declaration under Section 564(b)(1) of the Act, 21 U.S.C. section 360bbb-3(b)(1), unless the authorization is terminated or revoked sooner.     Influenza A by PCR NEGATIVE NEGATIVE Final   Influenza B by PCR NEGATIVE NEGATIVE Final    Comment: (NOTE) The Xpert Xpress SARS-CoV-2/FLU/RSV assay is intended as an aid in  the diagnosis of influenza from Nasopharyngeal swab specimens and  should not be used as a sole basis for treatment. Nasal washings and  aspirates are unacceptable for Xpert Xpress SARS-CoV-2/FLU/RSV  testing.  Fact Sheet for  Patients: https://www.moore.com/  Fact Sheet for Healthcare Providers: https://www.young.biz/  This test is not yet approved or cleared by the [place] FDA and  has been authorized for detection and/or diagnosis of SARS-CoV-2 by  FDA under an Emergency Use Authorization (EUA). This EUA will remain  in effect (meaning this test can be used) for the duration of the  Covid-19 declaration under Section 564(b)(1) of the Act, 21  U.S.C. section 360bbb-3(b)(1), unless the authorization is  terminated or revoked. Performed at [facility], [address] [place], [place] [address]   MRSA PCR Screening     Status: None   Collection Time: 05/13/20  1:16 AM   Specimen: Nasopharyngeal  Result Value Ref Range Status   MRSA by PCR NEGATIVE NEGATIVE Final    Comment:        The GeneXpert MRSA Assay (FDA approved for NASAL specimens only), is one component of a comprehensive MRSA colonization surveillance program. It is not intended to diagnose MRSA infection nor to guide or monitor treatment for MRSA infections. Performed at [facility], [address] [place], [place] [address]      Labs: BNP (last 3 results) No results for input(s): BNP in the last 8760 hours. Basic Metabolic Panel: Recent Labs  Lab 05/12/20 2002 05/13/20 0249 05/14/20 0321  NA 142 138 137  K 4.0 3.0* 4.9  CL 107 106 110  CO2 26 19* 21*  GLUCOSE 110* 273* 150*  BUN CREATININE 0.98 1.00 0.76  CALCIUM 9.1 8.1* 8.9  MG  --  2.3  --    Liver Function Tests: No results for input(s): AST, ALT, ALKPHOS, BILITOT, PROT, ALBUMIN in the last 168 hours. No results for input(s): LIPASE, AMYLASE in the last 168 hours. No results for input(s): AMMONIA in the last 168 hours. CBC: Recent Labs  Lab 05/12/20 2002 05/13/20 0249  WBC 8.4 8.8  NEUTROABS 3.0 8.1*  HGB 14.6 13.5  HCT 43.9 41.4  MCV 92.6 94.7  PLT 245 193   Cardiac  Enzymes: No results for input(s): CKTOTAL, CKMB, CKMBINDEX, TROPONINI in the last 168 hours. BNP: Invalid input(s): POCBNP CBG: No results for input(s): GLUCAP in the last 168 hours. D-Dimer No results for input(s): DDIMER in the last 72 hours. Hgb A1c No results for input(s): HGBA1C in the last 72 hours. Lipid Profile No results for input(s): CHOL, HDL, LDLCALC, TRIG, CHOLHDL, LDLDIRECT in the last 72 hours. Thyroid function studies No results for input(s): TSH, T4TOTAL, T3FREE, THYROIDAB in the last 72 hours.  Invalid input(s): FREET3 Anemia work up No results for input(s): VITAMINB12, FOLATE, FERRITIN, TIBC, IRON, RETICCTPCT in the last 72 hours. Urinalysis No results found for: COLORURINE, APPEARANCEUR, LABSPEC, PHURINE,  GLUCOSEU, HGBUR, BILIRUBINUR, KETONESUR, PROTEINUR, UROBILINOGEN, NITRITE, LEUKOCYTESUR Sepsis Labs Invalid input(s): PROCALCITONIN,  WBC,  LACTICIDVEN Microbiology Recent Results (from the past 240 hour(s))  Respiratory Panel by RT PCR (Flu A&B, Covid) - Nasopharyngeal Swab     Status: None   Collection Time: 05/12/20  8:02 PM   Specimen: Nasopharyngeal Swab  Result Value Ref Range Status   SARS Coronavirus 2 by RT PCR NEGATIVE NEGATIVE Final    Comment: (NOTE) SARS-CoV-2 target nucleic acids are NOT DETECTED.  The SARS-CoV-2 RNA is generally detectable in upper respiratoy specimens during the acute phase of infection. The lowest concentration of SARS-CoV-2 viral copies this assay can detect is 131 copies/mL. A negative result does not preclude SARS-Cov-2 infection and should not be used as the sole basis for treatment or other patient management decisions. A negative result may occur with  improper specimen collection/handling, submission of specimen other than nasopharyngeal swab, presence of viral mutation(s) within the areas targeted by this assay, and inadequate number of viral copies (<131 copies/mL). A negative result must be combined with  clinical observations, patient history, and epidemiological information. The expected result is Negative.  Fact Sheet for Patients:  https://www.moore.com/https://www.fda.gov/media/142436/download  Fact Sheet for Healthcare Providers:  https://www.young.biz/https://www.fda.gov/media/142435/download  This test is no t yet approved or cleared by the [place] FDA and  has been authorized for detection and/or diagnosis of SARS-CoV-2 by FDA under an Emergency Use Authorization (EUA). This EUA will remain  in effect (meaning this test can be used) for the duration of the COVID-19 declaration under Section 564(b)(1) of the Act, 21 U.S.C. section 360bbb-3(b)(1), unless the authorization is terminated or revoked sooner.     Influenza A by PCR NEGATIVE NEGATIVE Final   Influenza B by PCR NEGATIVE NEGATIVE Final    Comment: (NOTE) The Xpert Xpress SARS-CoV-2/FLU/RSV assay is intended as an aid in  the diagnosis of influenza from Nasopharyngeal swab specimens and  should not be used as a sole basis for treatment. Nasal washings and  aspirates are unacceptable for Xpert Xpress SARS-CoV-2/FLU/RSV  testing.  Fact Sheet for Patients: https://www.moore.com/https://www.fda.gov/media/142436/download  Fact Sheet for Healthcare Providers: https://www.young.biz/https://www.fda.gov/media/142435/download  This test is not yet approved or cleared by the [place] FDA and  has been authorized for detection and/or diagnosis of SARS-CoV-2 by  FDA under an Emergency Use Authorization (EUA). This EUA will remain  in effect (meaning this test can be used) for the duration of the  Covid-19 declaration under Section 564(b)(1) of the Act, 21  U.S.C. section 360bbb-3(b)(1), unless the authorization is  terminated or revoked. Performed at [facility], [address] [place], [place] [address]   MRSA PCR Screening     Status: None   Collection Time: 05/13/20  1:16 AM   Specimen: Nasopharyngeal  Result Value Ref Range Status   MRSA by PCR NEGATIVE NEGATIVE Final     Comment:        The GeneXpert MRSA Assay (FDA approved for NASAL specimens only), is one component of a comprehensive MRSA colonization surveillance program. It is not intended to diagnose MRSA infection nor to guide or monitor treatment for MRSA infections. Performed at [facility], [address] [place], [place] [address]     Please note: You were cared for by a hospitalist during your hospital stay. Once you are discharged, your primary care physician will handle any further medical issues. Please note that NO REFILLS for any discharge medications will be authorized once you are discharged,  as it is imperative that you return to your primary care physician (or establish a relationship with a primary care physician if you do not have one) for your post hospital discharge needs so that they can reassess your need for medications and monitor your lab values.    Time coordinating discharge: 40 minutes  SIGNED:   Burnadette Pop, MD  [facility] 05/15/2020, 10:45 AM Pager [phone]  If 7PM-7AM, please contact night-coverage [email or web address] Password [email or web address]

## 2020-05-15 NOTE — Plan of Care (Signed)
  Problem: Education: Goal: Knowledge of General Education information will improve Description: Including pain rating scale, medication(s)/side effects and non-pharmacologic comfort measures 05/15/2020 1007 by Crissie Figures, RN Outcome: Adequate for Discharge 05/15/2020 1007 by Crissie Figures, RN Outcome: Adequate for Discharge   Problem: Health Behavior/Discharge Planning: Goal: Ability to manage health-related needs will improve 05/15/2020 1007 by Crissie Figures, RN Outcome: Adequate for Discharge 05/15/2020 1007 by Crissie Figures, RN Outcome: Adequate for Discharge   Problem: Clinical Measurements: Goal: Ability to maintain clinical measurements within normal limits will improve 05/15/2020 1007 by Crissie Figures, RN Outcome: Adequate for Discharge 05/15/2020 1007 by Crissie Figures, RN Outcome: Adequate for Discharge Goal: Will remain free from infection 05/15/2020 1007 by Crissie Figures, RN Outcome: Adequate for Discharge 05/15/2020 1007 by Crissie Figures, RN Outcome: Adequate for Discharge Goal: Diagnostic test results will improve 05/15/2020 1007 by Crissie Figures, RN Outcome: Adequate for Discharge 05/15/2020 1007 by Crissie Figures, RN Outcome: Adequate for Discharge Goal: Respiratory complications will improve 05/15/2020 1007 by Crissie Figures, RN Outcome: Adequate for Discharge 05/15/2020 1007 by Crissie Figures, RN Outcome: Adequate for Discharge Goal: Cardiovascular complication will be avoided 05/15/2020 1007 by Crissie Figures, RN Outcome: Adequate for Discharge 05/15/2020 1007 by Crissie Figures, RN Outcome: Adequate for Discharge   Problem: Activity: Goal: Risk for activity intolerance will decrease 05/15/2020 1007 by Crissie Figures, RN Outcome: Adequate for Discharge 05/15/2020 1007 by Crissie Figures, RN Outcome: Adequate for Discharge   Problem: Nutrition: Goal: Adequate nutrition will be maintained 05/15/2020 1007 by Crissie Figures, RN Outcome: Adequate for Discharge 05/15/2020  1007 by Crissie Figures, RN Outcome: Adequate for Discharge   Problem: Coping: Goal: Level of anxiety will decrease 05/15/2020 1007 by Crissie Figures, RN Outcome: Adequate for Discharge 05/15/2020 1007 by Crissie Figures, RN Outcome: Adequate for Discharge   Problem: Elimination: Goal: Will not experience complications related to bowel motility 05/15/2020 1007 by Crissie Figures, RN Outcome: Adequate for Discharge 05/15/2020 1007 by Crissie Figures, RN Outcome: Adequate for Discharge Goal: Will not experience complications related to urinary retention 05/15/2020 1007 by Crissie Figures, RN Outcome: Adequate for Discharge 05/15/2020 1007 by Crissie Figures, RN Outcome: Adequate for Discharge   Problem: Pain Managment: Goal: General experience of comfort will improve 05/15/2020 1007 by Crissie Figures, RN Outcome: Adequate for Discharge 05/15/2020 1007 by Crissie Figures, RN Outcome: Adequate for Discharge   Problem: Safety: Goal: Ability to remain free from injury will improve 05/15/2020 1007 by Crissie Figures, RN Outcome: Adequate for Discharge 05/15/2020 1007 by Crissie Figures, RN Outcome: Adequate for Discharge   Problem: Skin Integrity: Goal: Risk for impaired skin integrity will decrease 05/15/2020 1007 by Crissie Figures, RN Outcome: Adequate for Discharge 05/15/2020 1007 by Crissie Figures, RN Outcome: Adequate for Discharge

## 2020-05-25 ENCOUNTER — Ambulatory Visit: Payer: Medicaid Other | Attending: Family | Admitting: Family

## 2020-05-25 ENCOUNTER — Other Ambulatory Visit: Payer: Self-pay

## 2020-05-25 NOTE — Progress Notes (Signed)
Patient did not show for appointment. Called patient at 12:00 pm and 12:06 pm and left voicemail with each call.

## 2020-06-18 ENCOUNTER — Other Ambulatory Visit: Payer: Self-pay

## 2020-06-18 ENCOUNTER — Ambulatory Visit
Admission: EM | Admit: 2020-06-18 | Discharge: 2020-06-18 | Disposition: A | Discharge status: Home / Self Care | Payer: Medicaid Other | Attending: Family Medicine | Admitting: Family Medicine

## 2020-06-18 ENCOUNTER — Encounter: Payer: Self-pay | Admitting: Emergency Medicine

## 2020-06-18 DIAGNOSIS — H00011 Hordeolum externum right upper eyelid: Secondary | ICD-10-CM

## 2020-06-18 DIAGNOSIS — M5431 Sciatica, right side: Secondary | ICD-10-CM

## 2020-06-18 MED ORDER — PREDNISONE 20 MG PO TABS: ORAL_TABLET | ORAL | 0 refills | Status: DC

## 2020-06-18 MED ORDER — TRAMADOL HCL 50 MG PO TABS
50.0000 mg | ORAL_TABLET | Freq: Four times a day (QID) | ORAL | 0 refills | Status: DC | PRN
Start: 2020-06-18 — End: 2022-07-18

## 2020-06-18 MED ORDER — DOXYCYCLINE HYCLATE 100 MG PO TABS: 100.0000 mg | ORAL_TABLET | Freq: Two times a day (BID) | ORAL | 0 refills | Status: DC

## 2020-06-18 NOTE — ED Triage Notes (Signed)
Pt here with redness and irritation to top right eye lid x 2 days and lower back pain worse on right side x 4 days

## 2020-06-18 NOTE — ED Provider Notes (Signed)
[name]    CSN: [id number] Arrival date & time: 06/18/20  1018      History   Chief Complaint Chief Complaint  Patient presents with  . Eye Pain  . Back Pain    HPI Laura Burns is a 49 y.o. female.   Established [facility] patient  Pt here with redness and irritation to top right eye lid x 2 days and lower back pain worse on right side x 4 days   The right upper lid has been swollen and tender for 3 days.  It is worse when she looks to the right.  There is been no injury there.  Patient's had several days of right low back pain with radiation into the right thigh.  She has some tingling in both of her thighs and numbness on the left.  The pain is made worse by straightening her leg.  She has had this before.  No fever.  She is feels that there is some weakness in the legs.  Patient recently started new job scanning packages.  It does not involve lifting     Past Medical History:  Diagnosis Date  . Asthma   . Hypertension     Patient Active Problem List   Diagnosis Date Noted  . Moderate persistent asthma 05/13/2020  . Hypertension   . Acute asthma exacerbation 05/12/2020    Past Surgical History:  Procedure Laterality Date  . CESAREAN SECTION      OB History   No obstetric history on file.      Home Medications    Prior to Admission medications   Medication Sig Start Date End Date Taking? Authorizing Provider  albuterol (PROVENTIL) (2.5 MG/3ML) 0.083% nebulizer solution Take 3 mLs (2.5 mg total) by nebulization every 6 (six) hours as needed for wheezing or shortness of breath. 05/15/20   Burnadette Pop, MD  albuterol (VENTOLIN HFA) 108 (90 Base) MCG/ACT inhaler Inhale 2 puffs into the lungs every 6 (six) hours as needed for wheezing or shortness of breath. 05/15/20   Burnadette Pop, MD  atorvastatin (LIPITOR) 20 MG tablet Take 20 mg by mouth daily. 02/13/20 08/11/20  [provider]  doxycycline (VIBRA-TABS) 100 MG tablet Take 1  tablet (100 mg total) by mouth 2 (two) times daily. 06/18/20   Elvina Sidle, MD  fluticasone-salmeterol (ADVAIR HFA) 517-00 MCG/ACT inhaler Inhale 2 puffs into the lungs 2 (two) times daily. 05/15/20   Burnadette Pop, MD  guaiFENesin-dextromethorphan (ROBITUSSIN DM) 100-10 MG/5ML syrup Take 5 mLs by mouth every 4 (four) hours as needed for cough. 05/15/20   Burnadette Pop, MD  losartan (COZAAR) 25 MG tablet Take 25 mg by mouth daily.    [provider]  montelukast (SINGULAIR) 10 MG tablet Take 1 tablet (10 mg total) by mouth at bedtime. 05/15/20 05/15/21  Burnadette Pop, MD  predniSONE (DELTASONE) 20 MG tablet Two daily with food 06/18/20   Elvina Sidle, MD  traMADol (ULTRAM) 50 MG tablet Take 1 tablet (50 mg total) by mouth every 6 (six) hours as needed. 06/18/20   Elvina Sidle, MD    Family History Family History  Problem Relation Age of Onset  . Diabetes Mother   . Hypothyroidism Mother   . Diabetes Father     Social History Social History   Tobacco Use  . Smoking status: Current Some Day Smoker  . Smokeless tobacco: Never Used  . Tobacco comment: "when I'm drinking"  Substance Use Topics  . Alcohol use: Yes  Comment: socially  . Drug use: Never     Allergies   Pollen extract and Dust mite extract   Review of Systems Review of Systems  Constitutional: Negative.   Eyes: Positive for pain and redness. Negative for discharge.  Musculoskeletal: Positive for back pain.     Physical Exam Triage Vital Signs ED Triage Vitals  Enc Vitals Group     BP 06/18/20 1032 (!) 148/72     Pulse Rate 06/18/20 1032 97     Resp 06/18/20 1032 18     Temp 06/18/20 1032 98.6 F (37 C)     Temp src --      SpO2 06/18/20 1032 97 %     Weight --      Height --      Head Circumference --      Peak Flow --      Pain Score 06/18/20 1033 8     Pain Loc --      Pain Edu? --      Excl. in GC? --    No data found.  Updated Vital Signs BP (!) 148/72 (BP  Location: Left Arm)   Pulse 97   Temp 98.6 F (37 C)   Resp 18   SpO2 97%    Physical Exam Vitals reviewed.  Constitutional:      Appearance: Normal appearance.  HENT:     Nose: Nose normal.     Mouth/Throat:     Mouth: Mucous membranes are moist.  Eyes:     Extraocular Movements: Extraocular movements intact.     Conjunctiva/sclera: Conjunctivae normal.     Comments: Swollen and red medial right upper eyelid  Pulmonary:     Effort: Pulmonary effort is normal.  Musculoskeletal:        General: No tenderness or signs of injury.     Cervical back: Normal range of motion and neck supple.     Comments: Sciatica on the right with straight leg raising  Skin:    General: Skin is warm and dry.  Neurological:     General: No focal deficit present.     Mental Status: She is alert.  Psychiatric:        Mood and Affect: Mood normal.      UC Treatments / Results  Labs (all labs ordered are listed, but only abnormal results are displayed) Labs Reviewed - No data to display  EKG   Radiology No results found.  Procedures Procedures (including critical care time)  Medications Ordered in UC Medications - No data to display  Initial Impression / Assessment and Plan / UC Course  I have reviewed the triage vital signs and the nursing notes.  Pertinent labs & imaging results that were available during my care of the patient were reviewed by me and considered in my medical decision making (see chart for details).    Final Clinical Impressions(s) / UC Diagnoses   Final diagnoses:  Sciatica, right side  Hordeolum externum of right upper eyelid   Discharge Instructions   None    ED Prescriptions    Medication Sig Dispense Auth. Provider   doxycycline (VIBRA-TABS) 100 MG tablet Take 1 tablet (100 mg total) by mouth 2 (two) times daily. 20 tablet Elvina Sidle, MD   traMADol (ULTRAM) 50 MG tablet Take 1 tablet (50 mg total) by mouth every 6 (six) hours as needed. 15  tablet Elvina Sidle, MD   predniSONE (DELTASONE) 20 MG tablet Two daily with food 10  tablet Elvina Sidle, MD     I have reviewed the PDMP during this encounter.   Elvina Sidle, MD 06/18/20 1051

## 2020-07-28 ENCOUNTER — Encounter (HOSPITAL_COMMUNITY): Payer: Self-pay | Admitting: *Deleted

## 2020-07-28 ENCOUNTER — Other Ambulatory Visit: Payer: Self-pay

## 2020-07-28 ENCOUNTER — Emergency Department (HOSPITAL_COMMUNITY)
Admission: EM | Admit: 2020-07-28 | Discharge: 2020-07-28 | Disposition: A | Discharge status: Home / Self Care | Payer: Medicaid Other | Attending: Emergency Medicine | Admitting: Emergency Medicine

## 2020-07-28 DIAGNOSIS — Z7951 Long term (current) use of inhaled steroids: Secondary | ICD-10-CM | POA: Diagnosis not present

## 2020-07-28 DIAGNOSIS — I10 Essential (primary) hypertension: Secondary | ICD-10-CM | POA: Diagnosis not present

## 2020-07-28 DIAGNOSIS — J4541 Moderate persistent asthma with (acute) exacerbation: Secondary | ICD-10-CM | POA: Diagnosis not present

## 2020-07-28 DIAGNOSIS — Z20822 Contact with and (suspected) exposure to covid-19: Secondary | ICD-10-CM

## 2020-07-28 DIAGNOSIS — U071 COVID-19: Secondary | ICD-10-CM | POA: Diagnosis not present

## 2020-07-28 DIAGNOSIS — R509 Fever, unspecified: Secondary | ICD-10-CM | POA: Diagnosis present

## 2020-07-28 DIAGNOSIS — Z87891 Personal history of nicotine dependence: Secondary | ICD-10-CM | POA: Insufficient documentation

## 2020-07-28 HISTORY — DX: Nonrheumatic mitral (valve) prolapse: I34.1

## 2020-07-28 LAB — SARS CORONAVIRUS 2 (TAT 6-24 HRS): SARS Coronavirus 2: POSITIVE — AB

## 2020-07-28 MED ORDER — ALBUTEROL SULFATE HFA 108 (90 BASE) MCG/ACT IN AERS
1.0000 | INHALATION_SPRAY | Freq: Once | RESPIRATORY_TRACT | Status: AC
  Administered 2020-07-28: 2 via RESPIRATORY_TRACT
  Filled 2020-07-28: qty 6.7

## 2020-07-28 NOTE — ED Provider Notes (Signed)
[facility]   CSN: [id number] Arrival date & time: 07/28/20  1003     History Chief Complaint  Patient presents with  . Diarrhea  . Headache  . Cough  . Fever  . Generalized Body Aches    Laura Burns is a 50 y.o. female possible history of asthma, hypertension who presents for evaluation of 2 to 3 days of fevers, nausea, body aches, headache, sore throat, rhinorrhea, nasal congestion, cough.  She has also had some diarrhea but states that is improved.  She reports fever is gone as high as 101.7.  She is intermittently taking Tylenol, Advil, Motrin for relief but states is not really helping.  She states his cough is productive of phlegm.  She reports occasionally, she will feel like she gets short of breath.  She has been using her inhalers at home.  She has not any vomiting but states she has had decreased appetite.  She has been vaccinated x2.  She has not received a booster.  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  She does not know if they have COVID.  The history is provided by the patient.       Past Medical History:  Diagnosis Date  . Asthma   . Hypertension   . MVP (mitral valve prolapse)     Patient Active Problem List   Diagnosis Date Noted  . Moderate persistent asthma 05/13/2020  . Hypertension   . Acute asthma exacerbation 05/12/2020    Past Surgical History:  Procedure Laterality Date  . CESAREAN SECTION    . EAR CYST EXCISION       OB History   No obstetric history on file.     Family History  Problem Relation Age of Onset  . Diabetes Mother   . Hypothyroidism Mother   . Diabetes Father     Social History   Tobacco Use  . Smoking status: Former Games developer  . Smokeless tobacco: Never Used  . Tobacco comment: "when I'm drinking"  Substance Use Topics  . Alcohol use: Yes    Comment: socially  . Drug use: Never    Home Medications Prior to Admission medications   Medication Sig  Start Date End Date Taking? Authorizing Provider  albuterol (PROVENTIL) (2.5 MG/3ML) 0.083% nebulizer solution Take 3 mLs (2.5 mg total) by nebulization every 6 (six) hours as needed for wheezing or shortness of breath. 05/15/20   Burnadette Pop, MD  albuterol (VENTOLIN HFA) 108 (90 Base) MCG/ACT inhaler Inhale 2 puffs into the lungs every 6 (six) hours as needed for wheezing or shortness of breath. 05/15/20   Burnadette Pop, MD  atorvastatin (LIPITOR) 20 MG tablet Take 20 mg by mouth daily. 02/13/20 08/11/20  [provider]  doxycycline (VIBRA-TABS) 100 MG tablet Take 1 tablet (100 mg total) by mouth 2 (two) times daily. 06/18/20   Elvina Sidle, MD  fluticasone-salmeterol (ADVAIR HFA) 948-54 MCG/ACT inhaler Inhale 2 puffs into the lungs 2 (two) times daily. 05/15/20   Burnadette Pop, MD  guaiFENesin-dextromethorphan (ROBITUSSIN DM) 100-10 MG/5ML syrup Take 5 mLs by mouth every 4 (four) hours as needed for cough. 05/15/20   Burnadette Pop, MD  losartan (COZAAR) 25 MG tablet Take 25 mg by mouth daily.    [provider]  montelukast (SINGULAIR) 10 MG tablet Take 1 tablet (10 mg total) by mouth at bedtime. 05/15/20 05/15/21  Burnadette Pop, MD  predniSONE (DELTASONE) 20 MG tablet Two daily with food 06/18/20  Elvina Sidle, MD  traMADol (ULTRAM) 50 MG tablet Take 1 tablet (50 mg total) by mouth every 6 (six) hours as needed. 06/18/20   Elvina Sidle, MD    Allergies    Pollen extract and Dust mite extract  Review of Systems   Review of Systems  Constitutional: Positive for activity change, appetite change, fatigue and fever.  HENT: Positive for congestion and sore throat. Negative for trouble swallowing.   Respiratory: Positive for cough. Negative for shortness of breath.   Cardiovascular: Negative for chest pain.  Gastrointestinal: Negative for abdominal pain, nausea and vomiting.  Genitourinary: Negative for dysuria and hematuria.  Musculoskeletal: Positive  for myalgias.  Neurological: Positive for headaches.  All other systems reviewed and are negative.   Physical Exam Updated Vital Signs BP (!) 169/93 (BP Location: Left Arm)   Pulse 94   Temp 99.3 F (37.4 C)   Resp 17   Ht 5\' 1"  (1.549 m)   Wt 70.3 kg   SpO2 96%   BMI 29.29 kg/m   Physical Exam Vitals and nursing note reviewed.  Constitutional:      Appearance: She is well-developed and well-nourished.  HENT:     Head: Normocephalic and atraumatic.     Mouth/Throat:     Comments: Posterior oropharynx is without any erythema, edema, exudates  Eyes:     General: No scleral icterus.       Right eye: No discharge.        Left eye: No discharge.     Extraocular Movements: EOM normal.     Conjunctiva/sclera: Conjunctivae normal.  Neck:     Comments: Neck is supple and without rigidity Pulmonary:     Effort: Pulmonary effort is normal.     Comments: Lungs clear to auscultation bilaterally.  Symmetric chest rise.  No wheezing, rales, rhonchi. Skin:    General: Skin is warm and dry.  Neurological:     Mental Status: She is alert.  Psychiatric:        Mood and Affect: Mood and affect normal.        Speech: Speech normal.        Behavior: Behavior normal.     ED Results / Procedures / Treatments   Labs (all labs ordered are listed, but only abnormal results are displayed) Labs Reviewed  SARS CORONAVIRUS 2 (TAT 6-24 HRS)    EKG None  Radiology No results found.  Procedures Procedures (including critical care time)  Medications Ordered in ED Medications  albuterol (VENTOLIN HFA) 108 (90 Base) MCG/ACT inhaler 1-2 puff (has no administration in time range)    ED Course  I have reviewed the triage vital signs and the nursing notes.  Pertinent labs & imaging results that were available during my care of the patient were reviewed by me and considered in my medical decision making (see chart for details).    MDM Rules/Calculators/A&P                           50 year old female who presents for evaluation of 2 days of fever, body aches, headache, diarrhea, nasal congestion, rhinorrhea.  She reports she has been vaccinated for COVID x2.  She has not received booster.  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  She does not know if they have been sick with COVID.  On initial arrival, she is afebrile, nontoxic-appearing.  Vital signs are stable.  Suspect viral illness such as  COVID-19.  History/physical exam not concerning for peritonsillar abscess, Ludwig angina, meningitis.  COVID test ordered at triage.  I discussed with me that she has a COVID test pending.  Encouraged home supportive care measures. At this time, patient exhibits no emergent life-threatening condition that require further evaluation in ED. Patient had ample opportunity for questions and discussion. All patient's questions were answered with full understanding. Strict return precautions discussed. Patient expresses understanding and agreement to plan.   Laura Burns was evaluated in Emergency Department on 07/28/2020 for the symptoms described in the history of present illness. She was evaluated in the context of the global COVID-19 pandemic, which necessitated consideration that the patient might be at risk for infection with the SARS-CoV-2 virus that causes COVID-19. Institutional protocols and algorithms that pertain to the evaluation of patients at risk for COVID-19 are in a state of rapid change based on information released by regulatory bodies including the CDC and federal and state organizations. These policies and algorithms were followed during the patient's care in the ED.  Portions of this note were generated with Scientist, clinical (histocompatibility and immunogenetics). Dictation errors may occur despite best attempts at proofreading.   Final Clinical Impression(s) / ED Diagnoses Final diagnoses:  Suspected COVID-19 virus infection    Rx / DC Orders ED Discharge Orders    None        Rosana Hoes 07/28/20 1401    Gerhard Munch, MD 07/28/20 [phone]

## 2020-07-28 NOTE — ED Triage Notes (Signed)
2 days of fever, body aches, diarrhea, headache, sore throat. OTC meds not helping

## 2020-07-28 NOTE — Discharge Instructions (Signed)
You have a COVID test pending. This may take several hours to come back. You can check online or use the MyChart App to look at your results.   You can take Tylenol or Ibuprofen as directed for pain. You can alternate Tylenol and Ibuprofen every 4 hours. If you take Tylenol at 1pm, then you can take Ibuprofen at 5pm. Then you can take Tylenol again at 9pm.   Make sure you are staying hydrated and drinking plenty of fluids.   Use inhaler as directed.   Return to the Emergency Dept for any difficulty breathing, vomiting/inability to keep any food or liquid downs, chest pain or any other worsening or concerning symptoms.

## 2020-07-29 ENCOUNTER — Telehealth: Payer: Self-pay | Admitting: Family

## 2020-07-29 NOTE — Telephone Encounter (Signed)
Called to discuss with patient about COVID-19 symptoms and the use of one of the available treatments for those with mild to moderate Covid symptoms and at a high risk of hospitalization.  Pt appears to qualify for outpatient treatment due to co-morbid conditions and/or a member of an at-risk group in accordance with the FDA Emergency Use Authorization.    Symptom onset:  Vaccinated: Yes   Booster? Unknown  Qualifiers: Asthma, hypertension, BMI >25  I attempted to speak with Ms. Lokken regarding treatment with monoclonal antibodies or oral antivirals for treatment of COVID and was unable to reach her. I left a voicemail with the hotline number and sent a Mychart message.  Marcos Eke, NP 07/29/2020 10:28 AM

## 2020-08-15 MED FILL — MONTELUKAST SOD 10 MG TAB: 10 | 30 days supply | Qty: 30 | Fill #1

## 2020-08-28 MED FILL — ALBUTEROL 0.083% INHAL SOLN: (2.5 MG/3ML | 25 days supply | Qty: 75 | Fill #1

## 2020-12-18 ENCOUNTER — Other Ambulatory Visit: Payer: Self-pay

## 2020-12-18 MED FILL — Fluticasone-Salmeterol Inhal Aerosol 115-21 MCG/ACT: RESPIRATORY_TRACT | 30 days supply | Qty: 12 | Fill #0 | Status: AC

## 2021-01-10 ENCOUNTER — Other Ambulatory Visit: Payer: Self-pay

## 2021-01-10 MED FILL — Albuterol Sulfate Inhal Aero 108 MCG/ACT (90MCG Base Equiv): RESPIRATORY_TRACT | 25 days supply | Qty: 8.5 | Fill #0 | Status: AC

## 2021-01-10 MED FILL — Albuterol Sulfate Soln Nebu 0.083% (2.5 MG/3ML): RESPIRATORY_TRACT | 7 days supply | Qty: 75 | Fill #0 | Status: AC

## 2021-01-11 ENCOUNTER — Other Ambulatory Visit: Payer: Self-pay

## 2021-01-22 ENCOUNTER — Encounter (HOSPITAL_COMMUNITY): Payer: Self-pay | Admitting: *Deleted

## 2021-01-22 ENCOUNTER — Emergency Department (HOSPITAL_COMMUNITY)
Admission: EM | Admit: 2021-01-22 | Discharge: 2021-01-22 | Disposition: A | Discharge status: Home / Self Care | Payer: Medicaid Other | Attending: Emergency Medicine | Admitting: Emergency Medicine

## 2021-01-22 ENCOUNTER — Other Ambulatory Visit: Payer: Self-pay

## 2021-01-22 DIAGNOSIS — Z7951 Long term (current) use of inhaled steroids: Secondary | ICD-10-CM | POA: Diagnosis not present

## 2021-01-22 DIAGNOSIS — Z79899 Other long term (current) drug therapy: Secondary | ICD-10-CM | POA: Insufficient documentation

## 2021-01-22 DIAGNOSIS — R0602 Shortness of breath: Secondary | ICD-10-CM | POA: Diagnosis present

## 2021-01-22 DIAGNOSIS — I10 Essential (primary) hypertension: Secondary | ICD-10-CM | POA: Diagnosis not present

## 2021-01-22 DIAGNOSIS — Z20822 Contact with and (suspected) exposure to covid-19: Secondary | ICD-10-CM | POA: Diagnosis not present

## 2021-01-22 DIAGNOSIS — Z87891 Personal history of nicotine dependence: Secondary | ICD-10-CM | POA: Diagnosis not present

## 2021-01-22 DIAGNOSIS — J4541 Moderate persistent asthma with (acute) exacerbation: Secondary | ICD-10-CM | POA: Insufficient documentation

## 2021-01-22 DIAGNOSIS — J45909 Unspecified asthma, uncomplicated: Secondary | ICD-10-CM

## 2021-01-22 LAB — POC SARS CORONAVIRUS 2 AG -  ED: SARSCOV2ONAVIRUS 2 AG: NEGATIVE

## 2021-01-22 MED ORDER — PREDNISONE 50 MG PO TABS: ORAL_TABLET | ORAL | 0 refills | Status: DC

## 2021-01-22 MED ORDER — IPRATROPIUM-ALBUTEROL 0.5-2.5 (3) MG/3ML IN SOLN
3.0000 mL | Freq: Once | RESPIRATORY_TRACT | Status: AC
  Administered 2021-01-22: 3 mL via RESPIRATORY_TRACT
  Filled 2021-01-22: qty 3

## 2021-01-22 MED ORDER — PREDNISONE 20 MG PO TABS
60.0000 mg | ORAL_TABLET | Freq: Every day | ORAL | Status: AC
  Administered 2021-01-22: 60 mg via ORAL
  Filled 2021-01-22: qty 3

## 2021-01-22 MED ORDER — ALBUTEROL SULFATE HFA 108 (90 BASE) MCG/ACT IN AERS
8.0000 | INHALATION_SPRAY | Freq: Once | RESPIRATORY_TRACT | Status: AC
  Administered 2021-01-22: 8 via RESPIRATORY_TRACT
  Filled 2021-01-22: qty 6.7

## 2021-01-22 NOTE — ED Notes (Signed)
This nurse heard yelling and cussing, opened blinds to see why patient was yelling and cussing.  Patient seen yelling at PA.  Apparently patient is upset that she has been here for 10 mins and nothing has been done for her.

## 2021-01-22 NOTE — ED Provider Notes (Signed)
[facility] Provider Note   CSN: [id number] Arrival date & time: 01/22/21  1002     History Chief Complaint  Patient presents with   Shortness of Breath    Laura Burns is a 50 y.o. female.  The history is provided by the patient. No language interpreter was used.  Shortness of Breath Severity:  Moderate Onset quality:  Gradual Duration:  3 days Timing:  Constant Progression:  Worsening Chronicity:  New Relieved by:  Nothing Worsened by:  Nothing Ineffective treatments:  Inhaler Risk factors: no family hx of DVT       Past Medical History:  Diagnosis Date   Asthma    Hypertension    MVP (mitral valve prolapse)     Patient Active Problem List   Diagnosis Date Noted   Moderate persistent asthma 05/13/2020   Hypertension    Acute asthma exacerbation 05/12/2020    Past Surgical History:  Procedure Laterality Date   CESAREAN SECTION     EAR CYST EXCISION       OB History   No obstetric history on file.     Family History  Problem Relation Age of Onset   Diabetes Mother    Hypothyroidism Mother    Diabetes Father     Social History   Tobacco Use   Smoking status: Former    Pack years: 0.00   Smokeless tobacco: Never   Tobacco comments:    "when I'm drinking"  Substance Use Topics   Alcohol use: Yes    Comment: socially   Drug use: Never    Home Medications Prior to Admission medications   Medication Sig Start Date End Date Taking? Authorizing Provider  albuterol (PROVENTIL) (2.5 MG/3ML) 0.083% nebulizer solution TAKE 3 MLS (2.5 MG TOTAL) BY NEBULIZATION EVERY 6 (SIX) HOURS AS NEEDED FOR WHEEZING OR SHORTNESS OF BREATH. 05/15/20 05/15/21  Burnadette Pop, MD  albuterol (VENTOLIN HFA) 108 (90 Base) MCG/ACT inhaler INHALE 2 PUFFS INTO THE LUNGS EVERY 6 (SIX) HOURS AS NEEDED FOR WHEEZING OR SHORTNESS OF BREATH. 05/15/20 05/15/21  Burnadette Pop, MD  atorvastatin (LIPITOR) 20 MG tablet Take 20 mg by mouth daily.  02/13/20 08/11/20  [provider]  doxycycline (VIBRA-TABS) 100 MG tablet Take 1 tablet (100 mg total) by mouth 2 (two) times daily. 06/18/20   Elvina Sidle, MD  fluticasone-salmeterol (ADVAIR HFA) [phone] MCG/ACT inhaler INHALE 2 PUFFS INTO THE LUNGS 2 (TWO) TIMES DAILY. 05/15/20 05/15/21  Burnadette Pop, MD  guaiFENesin-dextromethorphan (ROBITUSSIN DM) 100-10 MG/5ML syrup Take 5 mLs by mouth every 4 (four) hours as needed for cough. 05/15/20   Burnadette Pop, MD  losartan (COZAAR) 25 MG tablet Take 25 mg by mouth daily.    [provider]  montelukast (SINGULAIR) 10 MG tablet TAKE 1 TABLET (10 MG TOTAL) BY MOUTH AT BEDTIME. 05/15/20 05/15/21  Burnadette Pop, MD  predniSONE (DELTASONE) 20 MG tablet Two daily with food 06/18/20   Elvina Sidle, MD  predniSONE (DELTASONE) 20 MG tablet TAKE 2 TABLETS (40 MG TOTAL) BY MOUTH DAILY FOR 5 DAYS. 05/15/20 05/15/21  Burnadette Pop, MD  traMADol (ULTRAM) 50 MG tablet Take 1 tablet (50 mg total) by mouth every 6 (six) hours as needed. 06/18/20   Elvina Sidle, MD    Allergies    Pollen extract and Dust mite extract  Review of Systems   Review of Systems  Respiratory:  Positive for shortness of breath.   All other systems reviewed and are negative.  Physical Exam Updated  Vital Signs BP 135/90   Pulse 71   Temp 97.9 F (36.6 C) (Oral)   Resp 17   SpO2 94%   Physical Exam Vitals and nursing note reviewed.  Constitutional:      Appearance: She is well-developed.  HENT:     Head: Normocephalic.  Cardiovascular:     Rate and Rhythm: Normal rate and regular rhythm.  Pulmonary:     Effort: Respiratory distress present.     Breath sounds: Examination of the right-upper field reveals wheezing. Examination of the left-upper field reveals wheezing. Examination of the right-middle field reveals wheezing. Examination of the left-middle field reveals wheezing. Examination of the right-lower field reveals wheezing.  Examination of the left-lower field reveals wheezing. Wheezing present.  Abdominal:     General: There is no distension.  Musculoskeletal:        General: Normal range of motion.     Cervical back: Normal range of motion.  Skin:    General: Skin is warm.  Neurological:     General: No focal deficit present.     Mental Status: She is alert and oriented to person, place, and time.  Psychiatric:        Mood and Affect: Mood normal.    ED Results / Procedures / Treatments   Labs (all labs ordered are listed, but only abnormal results are displayed) Labs Reviewed  POC SARS CORONAVIRUS 2 AG -  ED    EKG None  Radiology No results found.  Procedures Procedures   Medications Ordered in ED Medications  albuterol (VENTOLIN HFA) 108 (90 Base) MCG/ACT inhaler 8 puff (8 puffs Inhalation Given 01/22/21 1047)  ipratropium-albuterol (DUONEB) 0.5-2.5 (3) MG/3ML nebulizer solution 3 mL (3 mLs Nebulization Given 01/22/21 1115)  predniSONE (DELTASONE) tablet 60 mg (60 mg Oral Given 01/22/21 1057)    ED Course  I have reviewed the triage vital signs and the nursing notes.  Pertinent labs & imaging results that were available during my care of the patient were reviewed by me and considered in my medical decision making (see chart for details).    MDM Rules/Calculators/A&P                          MDM:  Pt given albuterol inhaler,  POC covid is negative,  Pt given a duoneb and 60 of prednisone  Pt reports she feels much better.    Final Clinical Impression(s) / ED Diagnoses Final diagnoses:  Moderate persistent asthma with exacerbation  Moderate asthma, unspecified whether complicated, unspecified whether persistent    Rx / DC Orders ED Discharge Orders          Ordered    predniSONE (DELTASONE) 50 MG tablet        01/22/21 1201          An After Visit Summary was printed and given to the patient.    Elson Areas, [place] 01/22/21 1347    Pricilla Loveless, MD 01/22/21  [phone]

## 2021-01-22 NOTE — ED Triage Notes (Signed)
PCP sent pt here for asthma exacerbation that has been going for weeks that is not improving with nebulizer and regular meds.

## 2021-01-22 NOTE — Discharge Instructions (Addendum)
Return if any problems.

## 2021-02-27 ENCOUNTER — Other Ambulatory Visit: Payer: Self-pay | Admitting: Student in an Organized Health Care Education/Training Program

## 2021-02-27 DIAGNOSIS — R102 Pelvic and perineal pain: Secondary | ICD-10-CM

## 2021-03-01 ENCOUNTER — Other Ambulatory Visit: Payer: PRIVATE HEALTH INSURANCE

## 2021-04-23 ENCOUNTER — Other Ambulatory Visit: Payer: Self-pay

## 2021-04-23 ENCOUNTER — Ambulatory Visit (INDEPENDENT_AMBULATORY_CARE_PROVIDER_SITE_OTHER): Payer: Medicaid Other | Admitting: Pulmonary Disease

## 2021-04-23 ENCOUNTER — Encounter: Payer: Self-pay | Admitting: Pulmonary Disease

## 2021-04-23 ENCOUNTER — Ambulatory Visit: Payer: Medicaid Other

## 2021-04-23 VITALS — BP 150/100 | HR 80 | Temp 99.1°F | Ht 61.0 in | Wt 178.6 lb

## 2021-04-23 DIAGNOSIS — Z23 Encounter for immunization: Secondary | ICD-10-CM | POA: Diagnosis not present

## 2021-04-23 DIAGNOSIS — R0602 Shortness of breath: Secondary | ICD-10-CM | POA: Diagnosis not present

## 2021-04-23 MED ORDER — ADVAIR HFA 230-21 MCG/ACT IN AERO: 2.0000 | INHALATION_SPRAY | Freq: Two times a day (BID) | RESPIRATORY_TRACT | 12 refills | Status: AC

## 2021-04-23 MED ORDER — ALBUTEROL SULFATE HFA 108 (90 BASE) MCG/ACT IN AERS: 2.0000 | INHALATION_SPRAY | Freq: Four times a day (QID) | RESPIRATORY_TRACT | 1 refills | Status: DC | PRN

## 2021-04-23 NOTE — Patient Instructions (Signed)
Schedule for PFT Chest x-ray at next visit CBC with differential, IgE  Increase Advair to 230-still 2 puffs twice a day Prescription for albuterol  I will see you in 3 months  Graded exercises as tolerated  Call with significant concerns

## 2021-04-23 NOTE — Progress Notes (Signed)
Laura Burns    [id number]    [birth year]  Primary Care Physician:Patient, No Pcp Per (Inactive)  Referring Physician: Verlon Au, MD [phone] [address] 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   albuterol (VENTOLIN HFA) 108 (90 Base) MCG/ACT inhaler INHALE 2 PUFFS INTO THE LUNGS EVERY 6 (SIX) HOURS AS NEEDED FOR WHEEZING OR SHORTNESS OF BREATH.   fluticasone-salmeterol (ADVAIR HFA) 115-21 MCG/ACT inhaler INHALE 2 PUFFS INTO THE LUNGS 2 (TWO) TIMES DAILY.   montelukast (SINGULAIR) 10 MG tablet TAKE 1 TABLET (10 MG TOTAL) BY MOUTH AT BEDTIME.   atorvastatin (LIPITOR) 20 MG tablet Take 20 mg by mouth daily.   doxycycline (VIBRA-TABS) 100 MG tablet Take 1 tablet (100 mg total) by mouth 2 (two) times daily.   guaiFENesin-dextromethorphan (ROBITUSSIN DM) 100-10 MG/5ML syrup Take 5 mLs by mouth every 4 (four) hours as needed for cough.   losartan  (COZAAR) 25 MG tablet Take 25 mg by mouth daily.   predniSONE (DELTASONE) 50 MG tablet One tablet a day for 5 days   traMADol (ULTRAM) 50 MG tablet Take 1 tablet (50 mg total) by mouth every 6 (six) hours as needed.   No facility-administered encounter medications on file as of 04/23/2021.    Allergies as of 04/23/2021 - Review Complete 04/23/2021  Allergen Reaction Noted   Pollen extract Anaphylaxis and Itching 12/19/1988   Dust mite extract Itching 09/29/2018    Past Medical History:  Diagnosis Date   Asthma    Hypertension    MVP (mitral valve prolapse)     Past Surgical History:  Procedure Laterality Date   CESAREAN SECTION     EAR CYST EXCISION      Family History  Problem Relation Age of Onset   Diabetes Mother    Hypothyroidism Mother    Diabetes Father     Social History   Socioeconomic History   Marital status: Divorced    Spouse name: Not on file   Number of children: Not on file   Years of education: Not on file   Highest education level: Not on file  Occupational History   Not on file  Tobacco Use   Smoking status: Former   Smokeless tobacco: Never   Tobacco comments:    "when I'm drinking"  Substance and Sexual Activity   Alcohol use: Yes    Comment: socially   Drug use: Never   Sexual activity: Not on file  Other Topics Concern   Not on  file  Social History Narrative   Not on file   Social Determinants of Health   Financial Resource Strain: Not on file  Food Insecurity: Not on file  Transportation Needs: Not on file  Physical Activity: Not on file  Stress: Not on file  Social Connections: Not on file  Intimate Partner Violence: Not on file    Review of Systems  Respiratory:  Positive for chest tightness, shortness of breath and wheezing.    Vitals:   04/23/21 0854  BP: (!) 150/100  Pulse: 80  Temp: 99.1 F (37.3 C)  SpO2: 98%     Physical Exam Constitutional:      Appearance: She is obese.  HENT:     Head:  Normocephalic.     Nose: No congestion.     Mouth/Throat:     Mouth: Mucous membranes are moist.  Eyes:     Pupils: Pupils are equal, round, and reactive to light.  Cardiovascular:     Rate and Rhythm: Normal rate and regular rhythm.     Heart sounds: No murmur heard.   No friction rub.  Pulmonary:     Effort: No respiratory distress.     Breath sounds: No stridor. No wheezing or rhonchi.  Musculoskeletal:     Cervical back: No rigidity or tenderness.  Neurological:     Mental Status: She is alert.  Psychiatric:        Mood and Affect: Mood normal.    Data Reviewed: Last chest x-ray 05/12/2020-no acute infiltrate COVID infection 8 months ago Recent COVID tests negative  Assessment:  Moderate persistent asthma -Multiple triggers -On Advair, albuterol  Shortness of breath on exertion -Likely related to history of asthma   Plan/Recommendations: Increase dose of Advair from 115-230 -2 puffs twice a day -Inhaler technique was reviewed today and is good  Continue to avoid triggers as able  Continue albuterol to be used as needed  Obtain CBC with differential, IgE level Chest x-ray at next visit, PFT at next visit  Follow-up in 3 months  Encouraged to call with any significant concerns  Graded exercise as tolerated  Administer flu shot today  I spent 45 minutes dedicated to the care of this patient on the date of this encounter to include previsit review of records, face-to-face time with the patient discussing conditions above, post visit ordering of testing, clinical documentation with electronic health record and communicated necessary findings to members of the patient's care team   Virl Diamond MD [facility] 04/23/2021, 9:08 AM  CC: Verlon Au, MD

## 2021-04-25 ENCOUNTER — Other Ambulatory Visit: Payer: Self-pay | Admitting: Student in an Organized Health Care Education/Training Program

## 2021-04-25 DIAGNOSIS — Z1231 Encounter for screening mammogram for malignant neoplasm of breast: Secondary | ICD-10-CM

## 2021-04-27 ENCOUNTER — Other Ambulatory Visit: Payer: Self-pay

## 2021-04-27 ENCOUNTER — Encounter: Payer: Self-pay | Admitting: Physical Therapy

## 2021-04-27 ENCOUNTER — Ambulatory Visit
Payer: Medicaid Other | Attending: Student in an Organized Health Care Education/Training Program | Admitting: Physical Therapy

## 2021-04-27 VITALS — BP 162/115

## 2021-04-27 DIAGNOSIS — M545 Low back pain, unspecified: Secondary | ICD-10-CM | POA: Insufficient documentation

## 2021-04-27 DIAGNOSIS — M6281 Muscle weakness (generalized): Secondary | ICD-10-CM | POA: Diagnosis present

## 2021-04-27 DIAGNOSIS — M542 Cervicalgia: Secondary | ICD-10-CM | POA: Diagnosis present

## 2021-04-27 NOTE — Patient Instructions (Signed)
Access Code: [email or web address] URL: [email or web address] Date: 04/27/2021 Prepared by: Alphonzo Severance  Exercises Supine Lower Trunk Rotation - 1 x daily - 7 x weekly - 1 sets - 20 reps - 3 hold Supine Piriformis Stretch with Foot on Ground - 1 x daily - 7 x weekly - 3 sets - 10 reps

## 2021-04-27 NOTE — Therapy (Signed)
[facility] [address] [place], [place], [address] Phone: [phone]   Fax:  [phone]  Physical Therapy Evaluation  Patient Details  Name: Laura Burns MRN: [id number] Date of Birth: Feb 11, 1971 Referring Provider (PT): Madaline Brilliant, NP  Encounter Date: 04/27/2021   PT End of Session - 04/27/21 1218     Visit Number 1    Number of Visits 8    Date for PT Re-Evaluation 06/22/21    Authorization Type wellcare    PT Start Time 0945    PT Stop Time 1030    PT Time Calculation (min) 45 min    Activity Tolerance Patient tolerated treatment well    Behavior During Therapy [facility] for tasks assessed/performed             Past Medical History:  Diagnosis Date   Asthma    Hypertension    MVP (mitral valve prolapse)     Past Surgical History:  Procedure Laterality Date   CESAREAN SECTION     EAR CYST EXCISION      Vitals:   04/27/21 0953  BP: (!) 162/115      Subjective Assessment - 04/27/21 1215     Subjective Laura Burns is a 50 y.o. female who presents to clinic with chief complaint of shoulder, neck , and thoracic tightness.  Stabbing pain around L PSIS.  MOI/History of condition: Started months ago.  No trauma.  Pain location: L PSIS.  Red flags: some n/t in L LE, cramping.  Denies saddle anesthesia and bb changes, no unexpected weight loss.  Pt reports that blood pressure is often in "stroke zone"  48 hour pain intensity:  highest 10/10, current 0/10, best 0/10.  Aggs: getting up from sitting for long periods, squatting to ground.  Eases: sitting still.  Nature: stabbing, sharp.  Severity: high.  Irritability: low.  Stage: chronic.  Stability: staying the same.  24 hour pattern: worse in morning.  Vocation/requirements: desk job.  Hobbies: bowling.  Functional limitations/goals: reduce pain, get back to exercise.  Home environment: lives with son at home, 3 STE, single story home.  Assistive device: none.   Hand  dominance: R.  Falls: no falls.    Pertinent History Significant PMH: asthma, HTN (very high, often in the "stroke zone" per pt; MUST TAKE BP)                [facility] PT Assessment - 04/27/21 0001       Assessment   Medical Diagnosis Referral diagnosis: Dorsalgia, unspecified (M54.9)    Referring Provider (PT) Madaline Brilliant, NP    Onset Date/Surgical Date 01/25/21    Hand Dominance Right      Precautions   Precaution Comments Significant PMH: asthma, HTN (very high, often in the "stroke zone" per pt; MUST TAKE BP)      Restrictions   Other Position/Activity Restrictions CHECK BP      Balance Screen   Has the patient fallen in the past 6 months No      Prior Function   Level of Independence Independent      Observation/Other Assessments   Focus on Therapeutic Outcomes (FOTO)  NA      Sensation   Light Touch --   LE dermatomes: diminished R L5     ROM / Strength   AROM / PROM / Strength AROM;Strength      AROM   Overall AROM Comments limited in all planes d/t pain    AROM Assessment  Site Lumbar      Strength   Overall Strength --   Myotomal testing appears WNL: L hip ext and knee ext unreliable d/t L PSIS pain, but are >3/5   Overall Strength Comments slump (-)      Flexibility   Soft Tissue Assessment /Muscle Length yes    Hamstrings WNL    Quadriceps WNL      Palpation   Palpation comment TTP L PSIS, spring testing of lumbar spine unremarkable      Special Tests   Other special tests SLR and slump (-); (+) cluster for intraarticular hip pathology bil  hip limited in end range in ER,IR, Flexion by pain                        Objective measurements completed on examination: See above findings.                PT Education - 04/27/21 1215     Education Details POC, diagnosis, prognosis, HEP.  Pt educated via explanation, demonstration, and handout (HEP).  Pt confirms understanding verbally.   Talked extensively about importance  of finding a way to reduce her high BP.              PT Short Term Goals - 04/27/21 1221       PT SHORT TERM GOAL #1   Title Laura Burns will be >75% HEP compliant to improve carryover between sessions and facilitate independent management of condition    Target Date 05/18/21               PT Long Term Goals - 04/27/21 1221       PT LONG TERM GOAL #1   Title Laura Burns will report >/= 50% decrease in pain from evaluation  EVAL: 10/10 max pain  target date: 06/22/21      PT LONG TERM GOAL #2   Title Laura Burns will be able to move from sitting to standing, not limited by pain  EVAL: pain as high as 10/10  target date: 06/22/21      PT LONG TERM GOAL #3   Title Laura Burns will improve 30'' STS (MCID 2) to >/= 10x (w/ UE: N) to show improved LE strength and improved transfers  EVAL: Not tested d/t BP  target date: 06/22/21                    Plan - 04/27/21 1218     Clinical Impression Statement Laura Burns is a 50 y.o. female who presents to clinic with signs and sxs consistent with low back pain secondary to L SI irritation.  Also has concurrent signs of intraarticular hip pain.  Exam was limited d/t high BP; pt advised to follow up with PCP immediately to change medication.  She states that her BP today was "pretty good" and that she is working with her PCP frequently to adjust medications.  Discussed that we would be unable to complete therapy if her BP exceeded safe ranges.  Pt also has general complaints of thoracic and UT tightness and pain; these may be evaluated as needed in the future, but were not evaluated today d/t time.  Pt presents with pain and impairments/deficits in: hip ROM, lumbar range of motion.  Activity limitations include: transfers, standing, walking, stair navigation.  Participation limitations include: yardwork, housework, pain while ambulating in community.  Pt will benefit from skilled therapy to address pain and the  listed  deficits in order to achieve functional goals, enable safety and independence in completion of daily tasks, and return to PLOF.    Stability/Clinical Decision Making Stable/Uncomplicated    Clinical Decision Making Moderate    Rehab Potential Fair    PT Frequency 1x / week    PT Duration 8 weeks    PT Treatment/Interventions ADLs/Self Care Home Management;Aquatic Therapy;Electrical Stimulation;Iontophoresis 4mg /ml Dexamethasone;Gait training;Therapeutic activities;Therapeutic exercise;Neuromuscular re-education;Manual techniques;Dry needling;Vasopneumatic Device;Spinal Manipulations;Joint Manipulations    PT Next Visit Plan see if there is a direction preference of SI    PT Home Exercise Plan [email or web address]    Recommended Other Services Consult with PCP ASAP about BP    Consulted and Agree with Plan of Care Patient             Patient will benefit from skilled therapeutic intervention in order to improve the following deficits and impairments:  Pain, Decreased strength, Decreased range of motion, Hypomobility  Visit Diagnosis: Low back pain, unspecified back pain laterality, unspecified chronicity, unspecified whether sciatica present - Plan: PT plan of care cert/re-cert  Muscle weakness - Plan: PT plan of care cert/re-cert  Cervicalgia - Plan: PT plan of care cert/re-cert     Problem List Patient Active Problem List   Diagnosis Date Noted   Moderate persistent asthma 05/13/2020   Hypertension    Acute asthma exacerbation 05/12/2020    05/14/2020, PT 04/27/2021, 12:24 PM  [facility] [address] [place], [place], [place] Phone: [phone]   Fax:  [phone]  Name: Imoni Kohen MRN: Laura Burns Date of Birth: 1971-04-22

## 2021-05-04 ENCOUNTER — Ambulatory Visit: Payer: Medicaid Other | Admitting: Rehabilitative and Restorative Service Providers"

## 2021-05-11 ENCOUNTER — Telehealth: Payer: Self-pay | Admitting: Physical Therapy

## 2021-05-11 ENCOUNTER — Ambulatory Visit: Payer: Medicaid Other | Admitting: Physical Therapy

## 2021-05-11 NOTE — Telephone Encounter (Signed)
Called and informed patient of missed visit and provided reminder of next appt and attendance policy.  

## 2021-05-18 ENCOUNTER — Other Ambulatory Visit: Payer: Self-pay

## 2021-05-18 ENCOUNTER — Ambulatory Visit: Payer: Medicaid Other

## 2021-05-18 VITALS — BP 122/84 | HR 78

## 2021-05-18 DIAGNOSIS — M545 Low back pain, unspecified: Secondary | ICD-10-CM

## 2021-05-18 DIAGNOSIS — M6281 Muscle weakness (generalized): Secondary | ICD-10-CM

## 2021-05-18 DIAGNOSIS — M542 Cervicalgia: Secondary | ICD-10-CM

## 2021-05-18 NOTE — Therapy (Signed)
[facility] [address] [place], [place], [address] Phone: [phone]   Fax:  [phone]  Physical Therapy Treatment  Patient Details  Name: Laura Burns MRN: [id number] Date of Birth: Nov 26, 1970 Referring Provider (PT): Madaline Brilliant, NP   Encounter Date: 05/18/2021   PT End of Session - 05/18/21 1225     Visit Number 2    Number of Visits 8    Date for PT Re-Evaluation 06/22/21    Authorization Type wellcare    PT Start Time 1125    PT Stop Time 1210    PT Time Calculation (min) 45 min    Activity Tolerance Patient tolerated treatment well    Behavior During Therapy [facility] for tasks assessed/performed             Past Medical History:  Diagnosis Date   Asthma    Hypertension    MVP (mitral valve prolapse)     Past Surgical History:  Procedure Laterality Date   CESAREAN SECTION     EAR CYST EXCISION      Vitals:   05/18/21 1221  BP: 122/84  Pulse: 78  SpO2: 98%     Subjective Assessment - 05/18/21 1221     Subjective Pt reports her low back pain is the same. No to little pain especially when still and then significant pain with certain movements, especially twisting, turning sit to/grom supine.    Pertinent History Significant PMH: asthma, HTN (very high, often in the "stroke zone" per pt; MUST TAKE BP)    Currently in Pain? Yes    Pain Score 0-No pain   8/10 twisting, turning, sit to/from supine   Pain Location Back    Pain Orientation Left;Lower    Pain Descriptors / Indicators Sharp;Stabbing    Pain Type Chronic pain    Pain Onset More than a month ago    Pain Frequency Intermittent               [facility] PT Assessment - 05/18/21 0001       Palpation   SI assessment  ASIS and medial malleolus appear symmetrical in supine and the relationship did not chnage with moving into long sitting.              [facility] Adult PT Treatment/Exercise:  Therapeutic Exercise: - SKTC, L, x2, 20 sec -  piriformis stretch, L, x2, 20 sec - Figure 4 stretcth, L, x2, 20 sec - Bridging, small range, x10, 5 sec - Hip add set, x10, 5 sec - Hip abd, small range, x10, 5 sec  Manual Therapy: - STM to the lumbar paraspinals, QL, and glut max  Neuromuscular re-ed: -   Therapeutic Activity: -   Self-care/Home Management: - HEP for lumbopelvic flexibility and strengthening, use of pillow between knees hen sleeping for support. Proper technique to sit to/from supine and log rolling supine to/from SL. Pt voiced understanding re: instructions and returned demonstartion of HEP and proper turning and in/out of bed.                         PT Short Term Goals - 04/27/21 1221       PT SHORT TERM GOAL #1   Title Laura Burns will be >75% HEP compliant to improve carryover between sessions and facilitate independent management of condition    Target Date 05/18/21               PT Long Term  Goals - 04/27/21 1221       PT LONG TERM GOAL #1   Title Laura Burns will report >/= 50% decrease in pain from evaluation  EVAL: 10/10 max pain  target date: 06/22/21      PT LONG TERM GOAL #2   Title Laura Burns will be able to move from sitting to standing, not limited by pain  EVAL: pain as high as 10/10  target date: 06/22/21      PT LONG TERM GOAL #3   Title Laura Burns will improve 30'' STS (MCID 2) to >/= 10x (w/ UE: N) to show improved LE strength and improved transfers  EVAL: Not tested d/t BP  target date: 06/22/21                   Plan - 05/18/21 1225     Stability/Clinical Decision Making Stable/Uncomplicated    Clinical Decision Making Moderate    Rehab Potential Fair    PT Duration 8 weeks    PT Next Visit Plan see if there is a direction preference of SI. Assess response to HEP    PT Home Exercise Plan [email or web address]    Consulted and Agree with Plan of Care Patient             Patient will benefit from skilled therapeutic intervention in  order to improve the following deficits and impairments:  Pain, Decreased strength, Decreased range of motion, Hypomobility  Visit Diagnosis: Low back pain, unspecified back pain laterality, unspecified chronicity, unspecified whether sciatica present  Muscle weakness  Cervicalgia     Problem List Patient Active Problem List   Diagnosis Date Noted   Moderate persistent asthma 05/13/2020   Hypertension    Acute asthma exacerbation 05/12/2020    Laura Burns, PT 05/18/2021, 12:48 PM  [facility] [address] [place], [place], [address] Phone: [phone]   Fax:  [phone]  Name: Laura Burns MRN: [id number] Date of Birth: [birth year]

## 2021-05-25 ENCOUNTER — Encounter: Payer: Self-pay | Admitting: Physical Therapy

## 2021-05-25 ENCOUNTER — Ambulatory Visit
Payer: Medicaid Other | Attending: Student in an Organized Health Care Education/Training Program | Admitting: Physical Therapy

## 2021-05-25 ENCOUNTER — Other Ambulatory Visit: Payer: Self-pay

## 2021-05-25 DIAGNOSIS — M542 Cervicalgia: Secondary | ICD-10-CM | POA: Insufficient documentation

## 2021-05-25 DIAGNOSIS — M545 Low back pain, unspecified: Secondary | ICD-10-CM | POA: Insufficient documentation

## 2021-05-25 DIAGNOSIS — M6281 Muscle weakness (generalized): Secondary | ICD-10-CM | POA: Diagnosis present

## 2021-05-25 NOTE — Therapy (Addendum)
[place], [place], [address] Phone: [phone]   Fax:  [phone]  PHYSICAL THERAPY UNPLANNED DISCHARGE SUMMARY   Visits from Start of Care: 3  Current functional level related to goals / functional outcomes: Current status unknown   Remaining deficits: Current status unknown   Education / Equipment: Pt has not returned since visit listed below  Patient goals were not assessed. Patient is being discharged due to not returning since the last visit.  (the below note was addended to include the above D/C summary on 06/26/21)   Physical Therapy Treatment  Patient Details  Name: Laura Burns MRN: [id number] Date of Birth: [birth year] Referring Provider (PT): Carmine Savoy, NP   Encounter Date: 05/25/2021   PT End of Session - 05/25/21 1120     Visit Number 3    Number of Visits 8    Date for PT Re-Evaluation 06/22/21    Authorization Type [employer] Approved 12 PT visits from 05/04/2021-07/03/2021    PT Start Time 1120    PT Stop Time 1200    PT Time Calculation (min) 40 min    Activity Tolerance Patient tolerated treatment well    Behavior During Therapy [facility] for tasks assessed/performed             Past Medical History:  Diagnosis Date   Asthma    Hypertension    MVP (mitral valve prolapse)     Past Surgical History:  Procedure Laterality Date   CESAREAN SECTION     EAR CYST EXCISION      There were no vitals filed for this visit.   Subjective Assessment - 05/25/21 1123     Subjective Pt reports that her low back pain is somewhat improved.  She has been wearing a brace, which helps significnatly.  She reports 0/10 pain today    Pertinent History Significant PMH: asthma, HTN (very high, often in the "stroke zone" per pt; MUST TAKE BP)    Pain Onset More than a month ago             [facility] Adult PT Treatment/Exercise:   Therapeutic Exercise: - nu-step L3 68mwhile taking  subjective and planning session with patient - Bridging, Full range, x10, 5 sec - Hip add set, x10, 5 sec - Supine clamshell - alternating - YTB - 90-90 heel taps (not able d/t cramping) - knee ext machine (next visit) - knee flexion machine (next visit) - leg press - 3x10 - @ 25#   PT Short Term Goals - 05/25/21 1129       PT SHORT TERM GOAL #1   Title IGerald Stabswill be >75% HEP compliant to improve carryover between sessions and facilitate independent management of condition    Baseline 11/5: MET    Target Date 05/18/21               PT Long Term Goals - 04/27/21 1221       PT LONG TERM GOAL #1   Title IGerald Stabswill report >/= 50% decrease in pain from evaluation  EVAL: 10/10 max pain  target date: 06/22/21      PT LONG TERM GOAL #2   Title ISintia Mckissicwill be able to move from sitting to standing, not limited by pain  EVAL: pain as high as 10/10  target date: 06/22/21      PT LONG TERM GOAL #3   Title IVedha Tercerowill improve 30'' STS (MCID 2) to >/=  10x (w/ UE: N) to show improved LE strength and improved transfers  EVAL: Not tested d/t BP  target date: 06/22/21                   Plan - 05/25/21 1149     Clinical Impression Statement Overall, [name] is progressing fair with therapy.  Pt reports no increase in baseline pain following therapy.  Today we concentrated on core strengthening and hip strengthening.  Pt with general hypermobility and significant core and hip strength deficit.  Discussed importance of strengthening rather than stretching at home during therex; she confirms understanding.  Pt will continue to benefit from skilled physical therapy to address remaining deficits and achieve listed goals.  Continue per POC.    Stability/Clinical Decision Making Stable/Uncomplicated    Rehab Potential Fair    PT Duration 8 weeks    PT Next Visit Plan see if there is a direction preference of SI. Assess response to HEP    PT Home Exercise  Plan [email or web address]    Consulted and Agree with Plan of Care Patient             Patient will benefit from skilled therapeutic intervention in order to improve the following deficits and impairments:  Pain, Decreased strength, Decreased range of motion, Hypomobility  Visit Diagnosis: Low back pain, unspecified back pain laterality, unspecified chronicity, unspecified whether sciatica present  Muscle weakness  Cervicalgia     Problem List Patient Active Problem List   Diagnosis Date Noted   Moderate persistent asthma 05/13/2020   Hypertension    Acute asthma exacerbation 05/12/2020    [name], PT 05/25/2021, 11:58 AM  [facility] [address] [place], [place], [address] Phone: [phone]   Fax:  [phone]  Name: Laura Burns MRN: [id number] Date of Birth: 03/29/1971

## 2021-06-08 ENCOUNTER — Telehealth: Payer: Self-pay | Admitting: Rehabilitative and Restorative Service Providers"

## 2021-06-08 ENCOUNTER — Ambulatory Visit: Payer: Medicaid Other | Admitting: Rehabilitative and Restorative Service Providers"

## 2021-06-08 NOTE — Telephone Encounter (Signed)
Pt no showed for appt; PT attempted to phone pt but pt's mailbox is full. Unable to leave a message. To date, no other appts scheduled after today.

## 2021-08-13 ENCOUNTER — Other Ambulatory Visit: Payer: Self-pay | Admitting: Family Medicine

## 2021-08-13 ENCOUNTER — Other Ambulatory Visit: Payer: Self-pay

## 2021-08-13 ENCOUNTER — Ambulatory Visit
Admission: RE | Admit: 2021-08-13 | Discharge: 2021-08-13 | Disposition: A | Discharge status: Home / Self Care | Payer: Medicaid Other | Source: Ambulatory Visit | Attending: Family Medicine | Admitting: Family Medicine

## 2021-08-13 DIAGNOSIS — R52 Pain, unspecified: Secondary | ICD-10-CM

## 2021-08-13 IMAGING — DX DG FOOT COMPLETE 3+V*L*
3 series · 3 of 3 positions shown · non-contrast
Comparison: None.

CLINICAL DATA: Left foot pain after injury. Small toe pain. Blinds
fell on foot.

EXAM:
LEFT FOOT - COMPLETE 3+ VIEW

[foot supine dp]
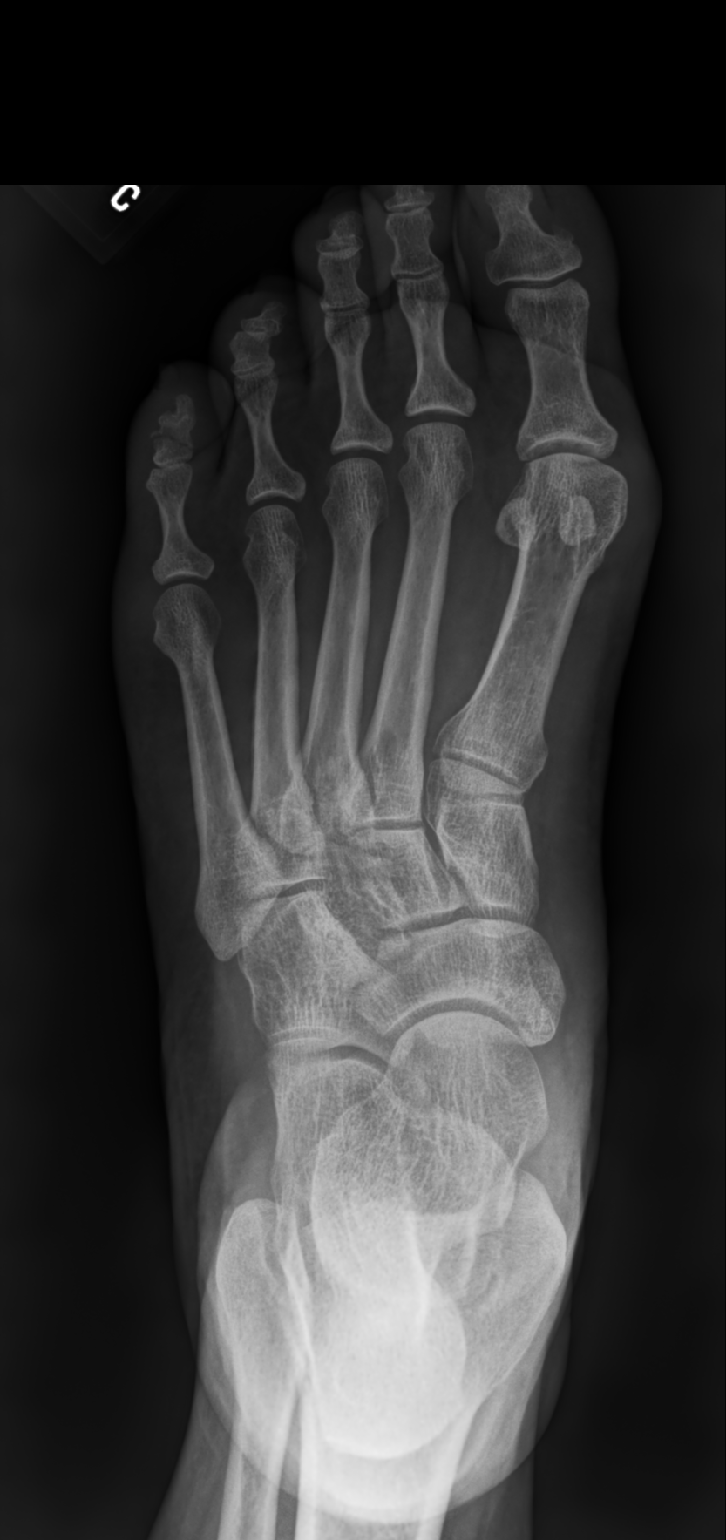

[foot medial oblique]
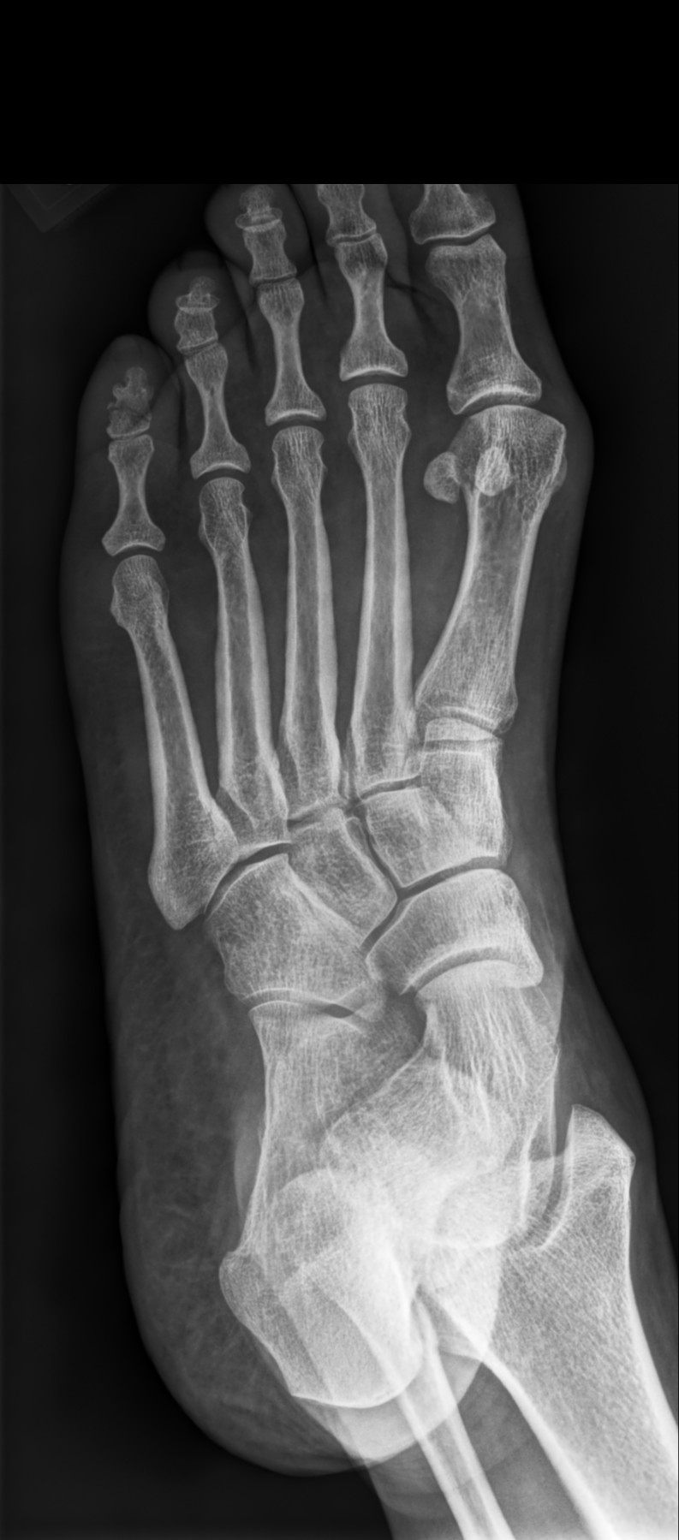

[foot supine lat]
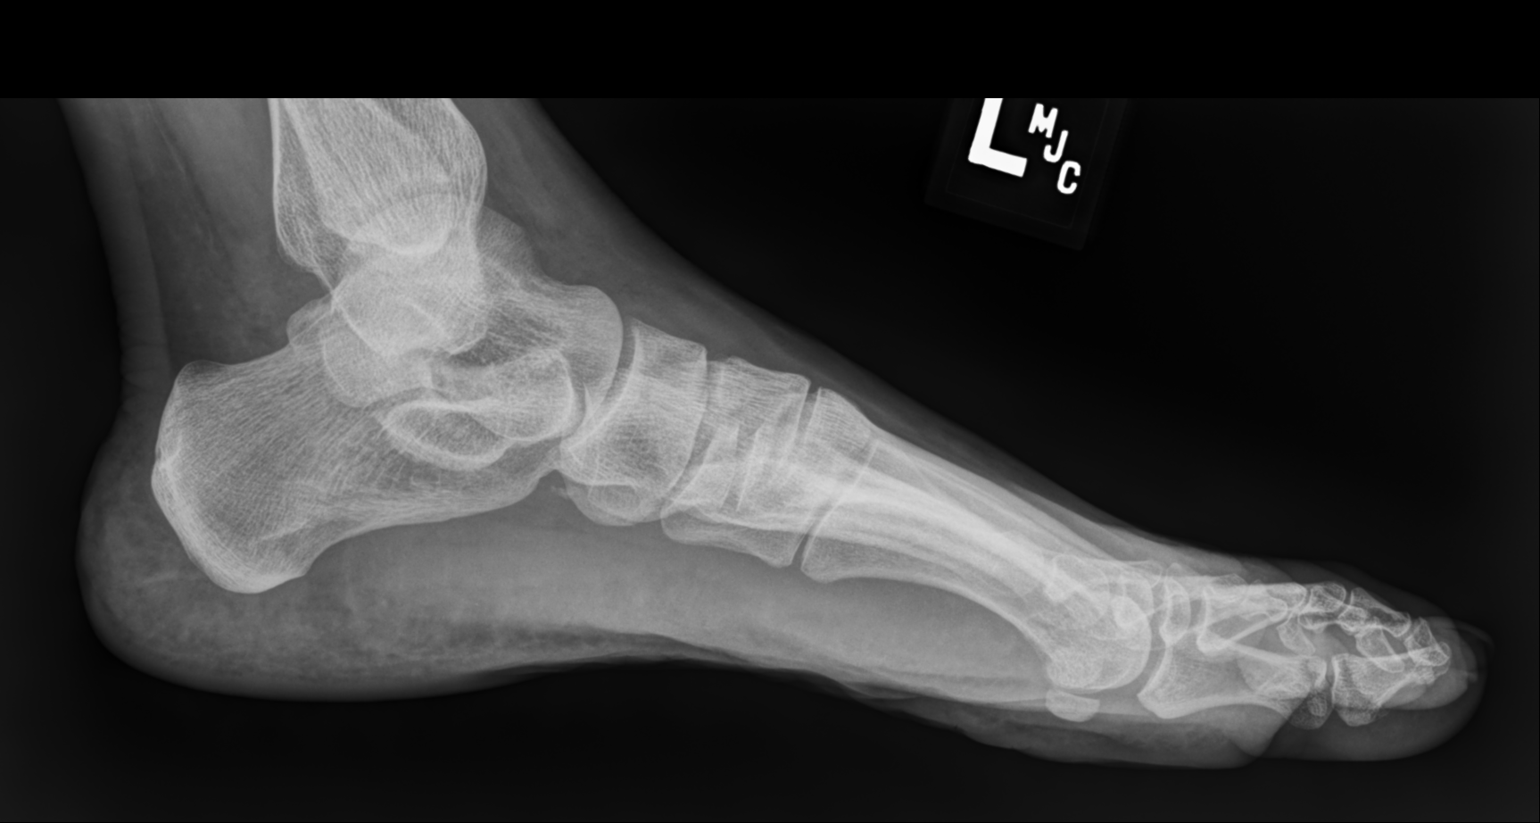

[3 of 3 positions shown; findings below may reference images not displayed]

FINDINGS: Oblique nondisplaced fracture of the little toe middle phalanx.
There is congenital fusion of the middle and distal phalanx. There
is no intra-articular extension to the proximal interphalangeal
joint. No additional fracture of the foot. Mild hallux valgus with
minimal degenerative change the first metatarsal phalangeal joint.
IMPRESSION: Oblique nondisplaced fracture of the little toe middle phalanx.

## 2021-09-26 IMAGING — DX DG CHEST 1V PORT
1 series · 1 of 1 positions shown · non-contrast
Comparison: None.

CLINICAL DATA: Asthma

EXAM:
PORTABLE CHEST 1 VIEW

[chest ap]
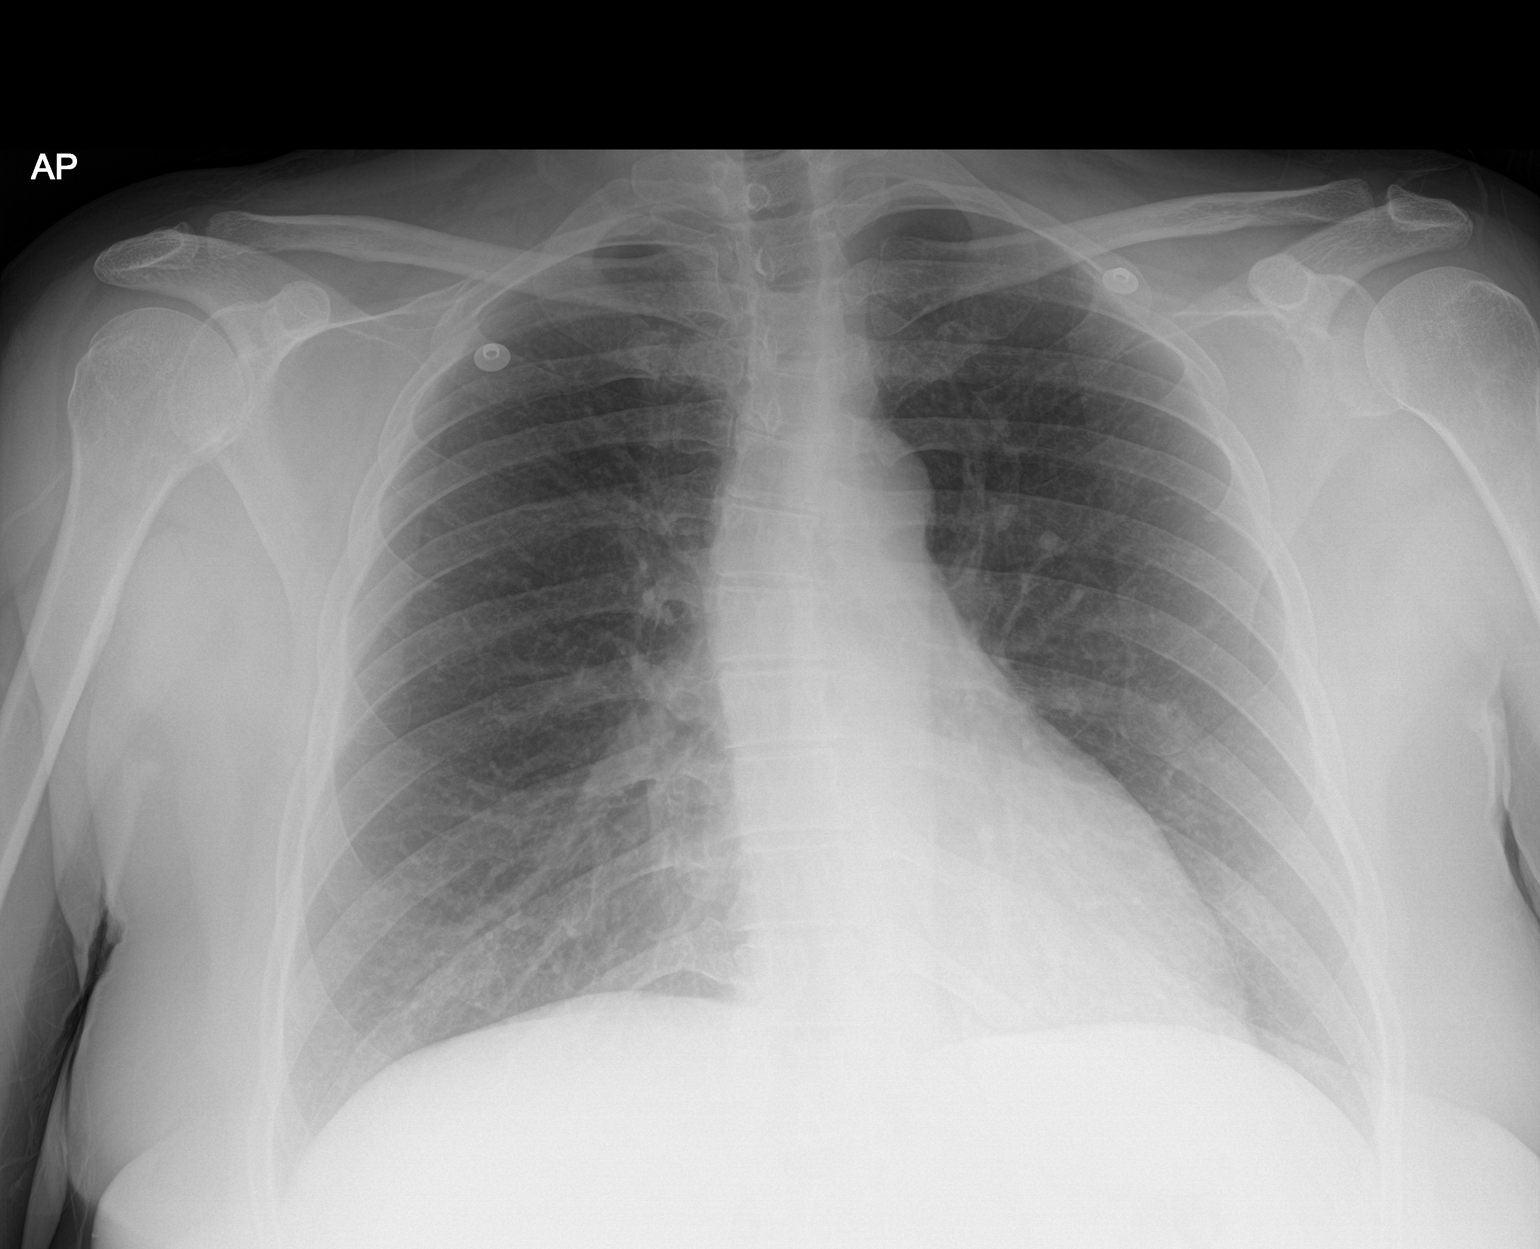

[1 of 1 positions shown; findings below may reference images not displayed]

FINDINGS: Lungs are clear. Heart size and pulmonary vascularity are normal. No
adenopathy. There is midthoracic dextroscoliosis.
IMPRESSION: Lungs clear.  Cardiac silhouette normal.

## 2021-10-01 ENCOUNTER — Other Ambulatory Visit: Payer: Self-pay

## 2021-10-01 ENCOUNTER — Encounter: Payer: Self-pay | Admitting: Neurology

## 2021-10-01 DIAGNOSIS — R202 Paresthesia of skin: Secondary | ICD-10-CM

## 2021-10-03 ENCOUNTER — Ambulatory Visit (INDEPENDENT_AMBULATORY_CARE_PROVIDER_SITE_OTHER): Payer: Medicaid Other | Admitting: Neurology

## 2021-10-03 ENCOUNTER — Other Ambulatory Visit: Payer: Self-pay

## 2021-10-03 DIAGNOSIS — R202 Paresthesia of skin: Secondary | ICD-10-CM | POA: Diagnosis not present

## 2021-10-03 NOTE — Procedures (Signed)
[facility]  ?[address] ? [place], [place] [address] ?Tel: [phone] ?Fax:  [phone] ?Test Date:  10/03/2021 ? ?Patient: Laura Burns DOB: 07/26/1970 Physician: Nita Sickle, DO  ?Sex: Female Height: 5\' 1"  Ref Phys: , MD  ?ID#: Laura Burns   Technician:   ? ?Patient Complaints: ?This is a 51 year old female with history of right CTS release referred for evaluation of bilateral hand paresthesias. ? ?NCV & EMG Findings: ?Extensive electrodiagnostic testing of the right upper extremity and additional studies of the left shows:  ?Right mixed palmar sensory responses show prolonged latency.  Left median sensory response shows prolonged latency (5.9 ms) and reduced amplitude (8.6 ?V).  Right median and bilateral ulnar sensory responses are within normal limits. ?Left median motor response shows prolonged latency (5.5 ms).  Right median and bilateral ulnar motor responses are within normal limits.   ?There is no evidence of active or chronic motor axonal loss changes affecting any of the tested muscles.  Motor unit configuration and recruitment pattern is within normal limits. ? 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    ?Right median neuropathy at or distal to the wrist (very mild), consistent with a clinical diagnosis of carpal tunnel syndrome.   ? ? ?___________________________ ?40, DO ? ? ? ?Nerve Conduction Studies ?Anti Sensory Summary Table ? ? Stim Site NR Peak (ms) Norm Peak (ms) P-T Amp (?V) Norm P-T Amp  ?Left Median Anti Sensory (2nd Digit)  33?C  ?Wrist    5.9 <3.6 8.6 >15  ?Right Median Anti Sensory (2nd Digit)  33?C  ?Wrist    2.9 <3.6 32.1 >15  ?Left Ulnar Anti Sensory (5th Digit)  33?C  ?Wrist    3.1 <3.1 34.3 >10  ?Right Ulnar Anti Sensory (5th Digit)  33?C  ?Wrist    2.8 <3.1 29.5 >10  ? ?Motor Summary Table ? ? Stim Site NR Onset (ms) Norm Onset (ms) O-P  Amp (mV) Norm O-P Amp Site1 Site2 Delta-0 (ms) Dist (cm) Vel (m/s) Norm Vel (m/s)  ?Left Median Motor (Abd Poll Brev)  33?C  ?Wrist    5.5 <4.0 11.4 >6 Elbow Wrist 5.2 27.0 52 >50  ?Elbow    10.7  11.2         ?Right Median Motor (Abd Poll Brev)  33?C  ?Wrist    3.1 <4.0 11.7 >6 Elbow Wrist 5.1 30.0 59 >50  ?Elbow    8.2  11.1         ?Left Ulnar Motor (Abd Dig Minimi)  33?C  ?Wrist    2.3 <3.1 8.2 >7 B Elbow Wrist 3.6 21.0 58 >50  ?B Elbow    5.9  7.9  A Elbow B Elbow 1.8 10.0 56 >50  ?A Elbow    7.7  7.6         ?Right Ulnar Motor (Abd Dig Minimi)  33?C  ?Wrist    2.2 <3.1 9.4 >7 B Elbow Wrist 3.7 22.0 59 >50  ?B Elbow    5.9  9.3  A Elbow B Elbow 1.6 10.0 63 >50  ?A Elbow    7.5  9.1         ? ?Comparison Summary Table ? ? Stim Site NR Peak (ms) Norm Peak (ms) P-T Amp (?V) Site1 Site2 Delta-P (ms) Norm Delta (ms)  ?Right Median/Ulnar Palm Comparison (Wrist - 8cm)  33?C  ?Median Nita Sickle  1.9 <2.2 92.2 Median Palm Ulnar Palm 0.6   ?Ulnar Palm    1.3 <2.2 16.0      ? ?EMG ? ? Side Muscle Ins Act Fibs Psw Fasc Number Recrt Dur Dur. Amp Amp. Poly Poly. Comment  ?Right 1stDorInt Nml Nml Nml Nml Nml Nml Nml Nml Nml Nml Nml Nml N/A  ?Right PronatorTeres Nml Nml Nml Nml Nml Nml Nml Nml Nml Nml Nml Nml N/A  ?Right Abd Poll Brev Nml Nml Nml Nml Nml Nml Nml Nml Nml Nml Nml Nml N/A  ?Right Biceps Nml Nml Nml Nml Nml Nml Nml Nml Nml Nml Nml Nml N/A  ?Right Triceps Nml Nml Nml Nml Nml Nml Nml Nml Nml Nml Nml Nml N/A  ?Right Deltoid Nml Nml Nml Nml Nml Nml Nml Nml Nml Nml Nml Nml N/A  ?Left 1stDorInt Nml Nml Nml Nml Nml Nml Nml Nml Nml Nml Nml Nml N/A  ?Left Abd Poll Brev Nml Nml Nml Nml Nml Nml Nml Nml Nml Nml Nml Nml N/A  ?Left PronatorTeres Nml Nml Nml Nml Nml Nml Nml Nml Nml Nml Nml Nml N/A  ?Left Biceps Nml Nml Nml Nml Nml Nml Nml Nml Nml Nml Nml Nml N/A  ?Left Triceps Nml Nml Nml Nml Nml Nml Nml Nml Nml Nml Nml Nml N/A  ?Left Deltoid Nml Nml Nml Nml Nml Nml Nml Nml Nml Nml Nml Nml N/A  ? ? ? ? ?Waveforms: ?    ? ?    ? ?     ? ? ?

## 2021-10-22 ENCOUNTER — Encounter: Payer: Medicaid Other | Admitting: Neurology

## 2021-10-31 DIAGNOSIS — G5602 Carpal tunnel syndrome, left upper limb: Secondary | ICD-10-CM | POA: Insufficient documentation

## 2021-11-06 ENCOUNTER — Other Ambulatory Visit (HOSPITAL_COMMUNITY): Payer: Self-pay

## 2021-11-13 ENCOUNTER — Other Ambulatory Visit: Payer: Self-pay

## 2021-11-13 MED ORDER — ALBUTEROL SULFATE (2.5 MG/3ML) 0.083% IN NEBU
INHALATION_SOLUTION | RESPIRATORY_TRACT | 3 refills | Status: DC
  Filled 2021-11-13 – 2022-01-04 (×2): qty 75, 6d supply, fill #0
  Filled 2022-01-06: qty 75, 6d supply, fill #1
  Filled 2022-04-30: qty 75, 6d supply, fill #2

## 2021-11-15 ENCOUNTER — Other Ambulatory Visit: Payer: Self-pay | Admitting: Family Medicine

## 2021-11-15 DIAGNOSIS — G8929 Other chronic pain: Secondary | ICD-10-CM

## 2021-11-15 DIAGNOSIS — Z124 Encounter for screening for malignant neoplasm of cervix: Secondary | ICD-10-CM

## 2021-11-20 ENCOUNTER — Other Ambulatory Visit: Payer: Self-pay

## 2021-12-20 ENCOUNTER — Other Ambulatory Visit: Payer: Self-pay

## 2022-01-03 ENCOUNTER — Ambulatory Visit
Admission: RE | Admit: 2022-01-03 | Discharge: 2022-01-03 | Disposition: A | Discharge status: Home / Self Care | Payer: Medicaid Other | Source: Ambulatory Visit | Attending: Family Medicine | Admitting: Family Medicine

## 2022-01-03 DIAGNOSIS — G8929 Other chronic pain: Secondary | ICD-10-CM

## 2022-01-03 DIAGNOSIS — Z124 Encounter for screening for malignant neoplasm of cervix: Secondary | ICD-10-CM

## 2022-01-04 ENCOUNTER — Other Ambulatory Visit: Payer: Self-pay | Admitting: Pulmonary Disease

## 2022-01-04 ENCOUNTER — Other Ambulatory Visit (HOSPITAL_COMMUNITY): Payer: Self-pay

## 2022-01-06 ENCOUNTER — Other Ambulatory Visit (HOSPITAL_COMMUNITY): Payer: Self-pay

## 2022-01-06 MED ORDER — ALBUTEROL SULFATE HFA 108 (90 BASE) MCG/ACT IN AERS
2.0000 | INHALATION_SPRAY | Freq: Four times a day (QID) | RESPIRATORY_TRACT | 3 refills | Status: AC | PRN
Start: 2022-01-06 — End: ?
  Filled 2022-01-06: qty 18, 25d supply, fill #0

## 2022-01-07 ENCOUNTER — Other Ambulatory Visit (HOSPITAL_COMMUNITY): Payer: Self-pay

## 2022-01-09 ENCOUNTER — Other Ambulatory Visit (HOSPITAL_COMMUNITY): Payer: Self-pay

## 2022-01-12 ENCOUNTER — Other Ambulatory Visit: Payer: Self-pay

## 2022-01-12 DIAGNOSIS — I8393 Asymptomatic varicose veins of bilateral lower extremities: Secondary | ICD-10-CM

## 2022-01-28 ENCOUNTER — Other Ambulatory Visit (HOSPITAL_COMMUNITY): Payer: Self-pay

## 2022-01-31 ENCOUNTER — Other Ambulatory Visit (HOSPITAL_COMMUNITY): Payer: Self-pay

## 2022-02-10 ENCOUNTER — Other Ambulatory Visit (HOSPITAL_COMMUNITY): Payer: Self-pay

## 2022-02-13 ENCOUNTER — Ambulatory Visit (INDEPENDENT_AMBULATORY_CARE_PROVIDER_SITE_OTHER): Payer: Medicaid Other | Admitting: Physician Assistant

## 2022-02-13 ENCOUNTER — Other Ambulatory Visit (HOSPITAL_COMMUNITY): Payer: Self-pay

## 2022-02-13 ENCOUNTER — Ambulatory Visit (HOSPITAL_COMMUNITY)
Admission: RE | Admit: 2022-02-13 | Discharge: 2022-02-13 | Disposition: A | Discharge status: Home / Self Care | Payer: Medicaid Other | Source: Ambulatory Visit | Attending: Vascular Surgery | Admitting: Vascular Surgery

## 2022-02-13 VITALS — BP 144/86 | HR 98 | Temp 97.6°F | Resp 14 | Ht 61.5 in | Wt 181.0 lb

## 2022-02-13 DIAGNOSIS — I8393 Asymptomatic varicose veins of bilateral lower extremities: Secondary | ICD-10-CM

## 2022-02-13 NOTE — Progress Notes (Signed)
Office Note     CC:  follow up Requesting Provider:  [name], MD  HPI: Laura Burns is a 51 y.o. (March 20, 1971) female who presents for evaluation of symptomatic varicose veins of especially right leg.  Over the past several months she has noticed increase in the prominence and symptoms of the varicosities of her proximal posterior thigh on the right leg.  She denies any history of DVT, venous ulcerations, trauma, or prior vascular intervention.  Her job requires her to be seated for most of the day.  She has worn compression in the past however does not wear them regularly.  She denies tobacco use.   Past Medical History:  Diagnosis Date   Asthma    Hypertension    MVP (mitral valve prolapse)     Past Surgical History:  Procedure Laterality Date   CESAREAN SECTION     EAR CYST EXCISION      Social History   Socioeconomic History   Marital status: Divorced    Spouse name: Not on file   Number of children: Not on file   Years of education: Not on file   Highest education level: Not on file  Occupational History   Not on file  Tobacco Use   Smoking status: Former   Smokeless tobacco: Never   Tobacco comments:    "when I'm drinking"  Substance and Sexual Activity   Alcohol use: Yes    Comment: socially   Drug use: Never   Sexual activity: Not on file  Other Topics Concern   Not on file  Social History Narrative   Not on file   Social Determinants of Health   Financial Resource Strain: Not on file  Food Insecurity: Not on file  Transportation Needs: Not on file  Physical Activity: Not on file  Stress: Not on file  Social Connections: Not on file  Intimate Partner Violence: Not on file    Family History  Problem Relation Age of Onset   Diabetes Mother    Hypothyroidism Mother    Diabetes Father     Current Outpatient Medications  Medication Sig Dispense Refill   albuterol (PROVENTIL) (2.5 MG/3ML) 0.083% nebulizer solution Inhale 1 vial via nebulizer  every 6 hours as needed. 75 mL 3   albuterol (VENTOLIN HFA) 108 (90 Base) MCG/ACT inhaler Inhale 2 puffs into the lungs every 6 hours as needed for wheezing or shortness of breath. 18 g 3   amLODipine (NORVASC) 10 MG tablet Take 1 tablet by mouth daily.     fluticasone-salmeterol (ADVAIR HFA) 230-21 MCG/ACT inhaler Inhale 2 puffs into the lungs 2 (two) times daily. 1 each 12   albuterol (PROVENTIL) (2.5 MG/3ML) 0.083% nebulizer solution TAKE 3 MLS (2.5 MG TOTAL) BY NEBULIZATION EVERY 6 (SIX) HOURS AS NEEDED FOR WHEEZING OR SHORTNESS OF BREATH. 75 mL 3   atorvastatin (LIPITOR) 20 MG tablet Take 20 mg by mouth daily.     doxycycline (VIBRA-TABS) 100 MG tablet Take 1 tablet (100 mg total) by mouth 2 (two) times daily. (Patient not taking: Reported on 02/13/2022) 20 tablet 0   guaiFENesin-dextromethorphan (ROBITUSSIN DM) 100-10 MG/5ML syrup Take 5 mLs by mouth every 4 (four) hours as needed for cough. (Patient not taking: Reported on 04/27/2021) 118 mL 1   losartan (COZAAR) 25 MG tablet Take 25 mg by mouth daily.     montelukast (SINGULAIR) 10 MG tablet TAKE 1 TABLET (10 MG TOTAL) BY MOUTH AT BEDTIME. (Patient not taking: Reported on 04/27/2021) 30 tablet  2   predniSONE (DELTASONE) 50 MG tablet One tablet a day for 5 days (Patient not taking: Reported on 04/27/2021) 5 tablet 0   traMADol (ULTRAM) 50 MG tablet Take 1 tablet (50 mg total) by mouth every 6 (six) hours as needed. (Patient not taking: Reported on 04/27/2021) 15 tablet 0   No current facility-administered medications for this visit.    Allergies  Allergen Reactions   Pollen Extract Anaphylaxis and Itching   Dust Mite Extract Itching     REVIEW OF SYSTEMS:   [X]  denotes positive finding, [ ]  denotes negative finding Cardiac  Comments:  Chest pain or chest pressure:    Shortness of breath upon exertion:    Short of breath when lying flat:    Irregular heart rhythm:        Vascular    Pain in calf, thigh, or hip brought on by  ambulation:    Pain in feet at night that wakes you up from your sleep:     Blood clot in your veins:    Leg swelling:         Pulmonary    Oxygen at home:    Productive cough:     Wheezing:         Neurologic    Sudden weakness in arms or legs:     Sudden numbness in arms or legs:     Sudden onset of difficulty speaking or slurred speech:    Temporary loss of vision in one eye:     Problems with dizziness:         Gastrointestinal    Blood in stool:     Vomited blood:         Genitourinary    Burning when urinating:     Blood in urine:        Psychiatric    Major depression:         Hematologic    Bleeding problems:    Problems with blood clotting too easily:        Skin    Rashes or ulcers:        Constitutional    Fever or chills:      PHYSICAL EXAMINATION:  Vitals:   02/13/22 1428  BP: (!) 144/86  Pulse: 98  Resp: 14  Temp: 97.6 F (36.4 C)  TempSrc: Temporal  SpO2: 96%  Weight: 181 lb (82.1 kg)  Height: 5' 1.5" (1.562 m)    General:  WDWN in NAD; vital signs documented above Gait: Not observed HENT: WNL, normocephalic Pulmonary: normal non-labored breathing , without Rales, rhonchi,  wheezing Cardiac: regular HR Abdomen: soft, NT, no masses Skin: without rashes Vascular Exam/Pulses:  Right Left  Radial 2+ (normal) 2+ (normal)  DP 2+ (normal) 2+ (normal)   Extremities: without ischemic changes, without Gangrene , without cellulitis; without open wounds;  Superficial veins of her posterior thigh and calf as well as cluster of superficial veins at the proximal posterior thigh of the right leg pictured below Musculoskeletal: no muscle wasting or atrophy  Neurologic: A&O X 3;  No focal weakness or paresthesias are detected Psychiatric:  The pt has Normal affect.      Non-Invasive Vascular Imaging:   Right lower extremity venous reflux study negative for DVT, negative for superficial reflux, negative for deep venous  reflux    ASSESSMENT/PLAN:: 51 y.o. female here for follow up for symptomatic veins of right leg  -Right lower extremity venous reflux study negative for DVT, superficial  reflux, and deep reflux.  She does have painful and tender superficial veins especially at the proximal portion of her posterior right thigh.  I believe these would be a candidate for sclerotherapy.  She will be contacted by Estell Harpin, RN to discuss sclerotherapy in more detail.  Recommendations also included compressive pantyhose given the location of her most symptomatic veins.  We also discussed elevation of her legs above the level of her heart periodically throughout the day.  She should also try her best to avoid prolonged sitting and standing.  She will be contacted to discuss sclerotherapy but otherwise can follow-up on an as-needed basis.   Dagoberto Ligas, PA-C [facility] [phone]  Clinic MD:   Scot Dock

## 2022-02-14 ENCOUNTER — Other Ambulatory Visit (HOSPITAL_COMMUNITY): Payer: Self-pay

## 2022-02-19 ENCOUNTER — Other Ambulatory Visit (HOSPITAL_COMMUNITY): Payer: Self-pay

## 2022-04-30 ENCOUNTER — Other Ambulatory Visit (HOSPITAL_COMMUNITY): Payer: Self-pay

## 2022-05-02 ENCOUNTER — Other Ambulatory Visit (HOSPITAL_COMMUNITY): Payer: Self-pay

## 2022-05-09 ENCOUNTER — Other Ambulatory Visit (HOSPITAL_COMMUNITY): Payer: Self-pay

## 2022-05-15 DIAGNOSIS — Z720 Tobacco use: Secondary | ICD-10-CM | POA: Insufficient documentation

## 2022-05-26 ENCOUNTER — Other Ambulatory Visit: Payer: Self-pay | Admitting: Physician Assistant

## 2022-05-26 DIAGNOSIS — Z1231 Encounter for screening mammogram for malignant neoplasm of breast: Secondary | ICD-10-CM

## 2022-07-18 ENCOUNTER — Telehealth: Payer: Medicaid Other | Admitting: Physician Assistant

## 2022-07-18 DIAGNOSIS — J4541 Moderate persistent asthma with (acute) exacerbation: Secondary | ICD-10-CM

## 2022-07-18 MED ORDER — ALBUTEROL SULFATE (2.5 MG/3ML) 0.083% IN NEBU: INHALATION_SOLUTION | RESPIRATORY_TRACT | 3 refills | Status: AC

## 2022-07-18 NOTE — Progress Notes (Signed)
Visit for Asthma  Based on what you have shared with me, it looks like you may have a flare up of your asthma.  Asthma is a chronic (ongoing) lung disease which results in airway obstruction, inflammation and hyper-responsiveness.   Asthma symptoms vary from person to person, with common symptoms including nighttime awakening and decreased ability to participate in normal activities as a result of shortness of breath. It is often triggered by changes in weather, changes in the season, changes in air temperature, or inside (home, school, daycare or work) allergens such as animal dander, mold, mildew, woodstoves or cockroaches.   It can also be triggered by hormonal changes, extreme emotion, physical exertion or an upper respiratory tract illness.     It is important to identify the trigger, and then eliminate or avoid the trigger if possible.   If you have been prescribed medications to be taken on a regular basis, it is important to follow the asthma action plan and to follow guidelines to adjust medication in response to increasing symptoms of decreased peak expiratory flow rate  Treatment: I have prescribed: Albuterol (Proventil) (2.5 mg in 3 mL) 0.083 % Take by nebulization solution every six hours as needed for wheezing or shortness of breath  HOME CARE Only take medications as instructed by your medical team. Consider wearing a mask or scarf to improve breathing air temperature have been shown to decrease irritation and decrease exacerbations Get rest. Taking a steamy shower or using a humidifier may help nasal congestion sand ease sore throat pain. You can place a towel over your head and breathe in the steam from hot water coming from a faucet. Using a saline nasal spray works much the same way.  Cough drops, hare candies and sore throat lozenges may ease your cough.  Avoid  close contacts especially the very you and the elderly Cover your mouth if you cough or sneeze Always remember to wash your hands.    GET HELP RIGHT AWAY IF: You develop worsening symptoms; breathlessness at rest, drowsy, confused or agitated, unable to speak in full sentences You have coughing fits You develop a severe headache or visual changes You develop shortness of breath, difficulty breathing or start having chest pain Your symptoms persist after you have completed your treatment plan If your symptoms do not improve within 10 days  MAKE SURE YOU Understand these instructions. Will watch your condition. Will get help right away if you are not doing well or get worse.   Your e-visit answers were reviewed by a board certified advanced clinical practitioner to complete your personal care plan, Depending upon the condition, your plan could have included both over the counter or prescription medications.   Please review your pharmacy choice. Your safety is important to [place]. If you have drug allergies check your prescription carefully.  You can use MyChart to ask questions about today's visit, request a non-urgent  call back, or ask for a work or school excuse for 24 hours related to this e-Visit. If it has been greater than 24 hours you will need to follow up with your provider, or enter a new e-Visit to address those concerns.   You will get an e-mail in the next two days asking about your experience. I hope that your e-visit has been valuable and will speed your recovery. Thank you for using e-visits.  I have spent 5 minutes in review of e-visit questionnaire, review and updating patient chart, medical decision making and response to  patient.   Margaretann Loveless, PA-C

## 2022-07-28 ENCOUNTER — Telehealth: Payer: Medicaid Other | Admitting: Nurse Practitioner

## 2022-07-28 DIAGNOSIS — J069 Acute upper respiratory infection, unspecified: Secondary | ICD-10-CM | POA: Diagnosis not present

## 2022-07-28 DIAGNOSIS — R7302 Impaired glucose tolerance (oral): Secondary | ICD-10-CM | POA: Insufficient documentation

## 2022-07-28 DIAGNOSIS — E785 Hyperlipidemia, unspecified: Secondary | ICD-10-CM | POA: Insufficient documentation

## 2022-07-28 MED ORDER — FLUTICASONE PROPIONATE 50 MCG/ACT NA SUSP: 2.0000 | Freq: Every day | NASAL | 6 refills | Status: AC

## 2022-07-28 MED ORDER — BENZONATATE 100 MG PO CAPS: 100.0000 mg | ORAL_CAPSULE | Freq: Three times a day (TID) | ORAL | 0 refills | Status: DC | PRN

## 2022-07-28 NOTE — Progress Notes (Signed)
E-Visit for Upper Respiratory Infection   We are sorry you are not feeling well.  Here is how we plan to help!  Based on what you have shared with me, it looks like you may have a viral upper respiratory infection.  Upper respiratory infections are caused by a large number of viruses; however, rhinovirus is the most common cause. Your symptoms may be from [place] or flu. We would recommend a home COVID test to see if that may be the cause.   Symptoms vary from person to person, with common symptoms including sore throat, cough, fatigue or lack of energy and feeling of general discomfort.  A low-grade fever of up to 100.4 may present, but is often uncommon.  Symptoms vary however, and are closely related to a person's age or underlying illnesses.  The most common symptoms associated with an upper respiratory infection are nasal discharge or congestion, cough, sneezing, headache and pressure in the ears and face.  These symptoms usually persist for about 3 to 10 days, but can last up to 2 weeks.  It is important to know that upper respiratory infections do not cause serious illness or complications in most cases.    Upper respiratory infections can be transmitted from person to person, with the most common method of transmission being a person's hands.  The virus is able to live on the skin and can infect other persons for up to 2 hours after direct contact.  Also, these can be transmitted when someone coughs or sneezes; thus, it is important to cover the mouth to reduce this risk.  To keep the spread of the illness at [place], good hand hygiene is very important.  This is an infection that is most likely caused by a virus. There are no specific treatments other than to help you with the symptoms until the infection runs its course.  We are sorry you are not feeling well.  Here is how we plan to help!   For nasal congestion, you may use an oral decongestants such as plain Mucinex.  Saline nasal spray or nasal  drops can help and can safely be used as often as needed for congestion.  For your congestion, I have prescribed Fluticasone nasal spray one spray in each nostril twice a day  For cough I have prescribed for you A prescription cough medication called Tessalon Perles 100 mg. You may take 1-2 capsules every 8 hours as needed for cough  If you have a sore or scratchy throat, use a saltwater gargle-  to  teaspoon of salt dissolved in a 4-ounce to 8-ounce glass of warm water.  Gargle the solution for approximately 15-30 seconds and then spit.  It is important not to swallow the solution.  You can also use throat lozenges/cough drops and Chloraseptic spray to help with throat pain or discomfort.  Warm or cold liquids can also be helpful in relieving throat pain.  For headache, pain or general discomfort, you can alternate Ibuprofen (up to 600mg  every 6 hours) and Tylenol (2 extra strength every 8 hours) as directed.   Some authorities believe that zinc sprays or the use of Echinacea may shorten the course of your symptoms.  Meds ordered this encounter  Medications   benzonatate (TESSALON) 100 MG capsule    Sig: Take 1 capsule (100 mg total) by mouth 3 (three) times daily as needed.    Dispense:  30 capsule    Refill:  0   fluticasone (FLONASE) 50 MCG/ACT nasal  spray    Sig: Place 2 sprays into both nostrils daily.    Dispense:  16 g    Refill:  6      HOME CARE Only take medications as instructed by your medical team. Be sure to drink plenty of fluids. Water is fine as well as fruit juices, sodas and electrolyte beverages. You may want to stay away from caffeine or alcohol. If you are nauseated, try taking small sips of liquids. How do you know if you are getting enough fluid? Your urine should be a pale yellow or almost colorless. Get rest. Taking a steamy shower or using a humidifier may help nasal congestion and ease sore throat pain. You can place a towel over your head and breathe in the  steam from hot water coming from a faucet. Using a saline nasal spray works much the same way. Cough drops, hard candies and sore throat lozenges may ease your cough. Avoid close contacts especially the very young and the elderly Cover your mouth if you cough or sneeze Always remember to wash your hands.   GET HELP RIGHT AWAY IF: You develop worsening fever. If your symptoms do not improve within 10 days You develop yellow or green discharge from your nose over 3 days. You have coughing fits You develop a severe head ache or visual changes. You develop shortness of breath, difficulty breathing or start having chest pain Your symptoms persist after you have completed your treatment plan  MAKE SURE YOU  Understand these instructions. Will watch your condition. Will get help right away if you are not doing well or get worse.  Thank you for choosing an e-visit.  Your e-visit answers were reviewed by a board certified advanced clinical practitioner to complete your personal care plan. Depending upon the condition, your plan could have included both over the counter or prescription medications.  Please review your pharmacy choice. Make sure the pharmacy is open so you can pick up prescription now. If there is a problem, you may contact your provider through [employer] and have the prescription routed to another pharmacy.  Your safety is important to [place]. If you have drug allergies check your prescription carefully.   For the next 24 hours you can use MyChart to ask questions about today's visit, request a non-urgent call back, or ask for a work or school excuse. You will get an email in the next two days asking about your experience. I hope that your e-visit has been valuable and will speed your recovery.  I spent approximately 5 minutes reviewing the patient's history, current symptoms and coordinating their care today.

## 2023-04-13 ENCOUNTER — Other Ambulatory Visit: Payer: Self-pay

## 2023-04-13 ENCOUNTER — Ambulatory Visit (INDEPENDENT_AMBULATORY_CARE_PROVIDER_SITE_OTHER): Payer: Medicaid Other | Admitting: Orthopedic Surgery

## 2023-04-13 DIAGNOSIS — G5603 Carpal tunnel syndrome, bilateral upper limbs: Secondary | ICD-10-CM

## 2023-04-13 NOTE — Progress Notes (Signed)
Laura Burns - 52 y.o. female MRN [id number]  Date of birth: [birth year]  Office Visit Note: Visit Date: 04/13/2023 PCP: [name], MD (Inactive) Referred by: Quita Skye, PA-C  Subjective: No chief complaint on file.  HPI: Laura Burns is a pleasant 52 y.o. female who presents today for bilateral hand complaints.  She is describing numbness and ting of the radial sided digits of the left hand which is present now for multiple years, worsening in nature with associated nocturnal symptoms.  She has history of prior right sided carpal tunnel release done 20+ years ago in [place], she has recently noticed recurrence of her prior carpal tunnel symptoms on that side as well with numbness and tingling and nocturnal symptoms.  Pertinent ROS were reviewed with the patient and found to be negative unless otherwise specified above in HPI.   Visit Reason: bilateral wrists R>L Duration of symptoms: years Hand dominance: right Occupation: [employer] Diabetic: Yes/ 5.5 Smoking: No Heart/Lung History: asthma Blood Thinners:  none  Prior Testing/EMG: 10/03/21 EMG Injections (Date): none Treatments: surgery right open carpal tunnel release, bracing bilaterally Prior Surgery: CTR 20+ years ago  Assessment & Plan: Visit Diagnoses:  1. Bilateral carpal tunnel syndrome     Plan: Extensive discussion was had the patient today regarding her bilateral hand complaints.  Her left side does demonstrate both clinical and electrodiagnostic evidence of carpal tunnel syndrome that is refractory to conservative care in the form of activity modification and bracing.  We discussed conservative versus surgical treatment options for the left side.  From a conservative standpoint, we discussed ongoing bracing, injections and potential therapy.  At this juncture, given her refractory symptoms, she is interested in surgical scheduling which I am in agreement with.  She is indicated for left open versus  endoscopic carpal tunnel release.  Understanding risk and benefits, she would like to have surgery done in the form of left open carpal tunnel release with light sedation.    Risks and benefits of the procedure were discussed, risks including but not limited to infection, bleeding, scarring, stiffness, nerve injury, tendon injury, vascular injury, recurrence of symptoms and need for subsequent operation.  Patient expressed understanding.  We will move forward with surgical scheduling.  For the right hand, she is demonstrating clinical signs and symptoms consistent with recurrent carpal tunnel syndrome.  I reviewed the results of her electrodiagnostic study which are relatively mild in nature.  For the time being, I would like to treat her with a cortisone injection to the right carpal tunnel for both diagnostic and therapeutic purposes.  This will better help guide further treatment of consideration for recurrent carpal tunnel release.  Will also have her fitted for a right wrist brace as she has not been utilizing bracing of the right side recently.  Follow-up: No follow-ups on file.   Meds & Orders: No orders of the defined types were placed in this encounter.   Orders Placed This Encounter  Procedures   XR Wrist Complete Right   XR Wrist Complete Left     Procedures: No procedures performed      Clinical History: No specialty comments available.  She reports that she has quit smoking. She has never used smokeless tobacco. No results for input(s): "HGBA1C", "LABURIC" in the last 8760 hours.  Objective:   Vital Signs: There were no vitals taken for this visit.  Physical Exam  Gen: Well-appearing, in no acute distress; non-toxic CV: Regular Rate. Well-perfused. Warm.  Resp:  Breathing unlabored on room air; no wheezing. Psych: Fluid speech in conversation; appropriate affect; normal thought process  Ortho Exam PHYSICAL EXAM:  General: Patient is well appearing and in no distress.  Cervical spine mobility is full in all directions:  Skin and Muscle: Prior well-healed right carpal tunnel incision, palmar aspect right hand  Range of Motion and Palpation Tests: Mobility is full about the elbows with flexion and extension. Forearm supination and pronation are 85/85 bilaterally.  Wrist flexion/extension is 75/65 bilaterally.  Digital flexion and extension are full.  Thumb opposition is full to the base of the small fingers bilaterally.    No cords or nodules are palpated.  No triggering is observed.     Neurologic, Vascular, Motor: Sensation is diminished to light touch in the bilateral median nerve distributions.   2-point discrimination left side median nerve 9 mm.   2 point discrimination right side median nerve between 7 and 8 mm.   Tinel's testing positive bilateral carpal tunnel Phalen's positive bilateral, Derkan's compression positive bilateral Thenar atrophy: Negative APB: 5/5 bilateral  Fingers pink and well perfused.  Capillary refill is brisk.     No results found for: "HGBA1C"   Imaging: No results found.  Past Medical/Family/Surgical/Social History: Medications & Allergies reviewed per EMR, new medications updated. Patient Active Problem List   Diagnosis Date Noted   Hyperlipidemia 07/28/2022   Impaired glucose tolerance 07/28/2022   Tobacco abuse 05/15/2022   Carpal tunnel syndrome of left wrist 10/31/2021   Moderate persistent asthma 05/13/2020   Hypertension    Acute asthma exacerbation 05/12/2020   Elevated cholesterol with high triglycerides 02/13/2020   Prediabetes 02/13/2020   Vitamin D deficiency 02/13/2020   Arm pain, anterior, left 02/06/2020   BMI 29.0-29.9,adult 02/06/2020   Allergies 07/19/2019   Allergic rhinitis 09/29/2018   Depression 09/29/2018   Encounter for wellness examination in adult 09/29/2018   Irregular menstruation 09/29/2018   Mitral valve prolapse 09/29/2018   Nicotine dependence, uncomplicated  09/29/2018   Past Medical History:  Diagnosis Date   Asthma    Hypertension    MVP (mitral valve prolapse)    Family History  Problem Relation Age of Onset   Diabetes Mother    Hypothyroidism Mother    Diabetes Father    Past Surgical History:  Procedure Laterality Date   CESAREAN SECTION     EAR CYST EXCISION     Social History   Occupational History   Not on file  Tobacco Use   Smoking status: Former   Smokeless tobacco: Never   Tobacco comments:    "when I'm drinking"  Substance and Sexual Activity   Alcohol use: Yes    Comment: socially   Drug use: Never   Sexual activity: Not on file    Dorothy Polhemus Fara Boros) Denese Killings, M.D. [facility] 3:57 PM

## 2023-05-11 ENCOUNTER — Other Ambulatory Visit: Payer: Self-pay | Admitting: Orthopedic Surgery

## 2023-05-11 ENCOUNTER — Telehealth: Payer: Self-pay | Admitting: Orthopedic Surgery

## 2023-05-11 DIAGNOSIS — G5603 Carpal tunnel syndrome, bilateral upper limbs: Secondary | ICD-10-CM

## 2023-05-11 NOTE — Telephone Encounter (Signed)
[employer] forms received. To [employer]. 

## 2023-05-29 ENCOUNTER — Ambulatory Visit (INDEPENDENT_AMBULATORY_CARE_PROVIDER_SITE_OTHER): Payer: Medicaid Other | Admitting: Orthopedic Surgery

## 2023-05-29 DIAGNOSIS — G5603 Carpal tunnel syndrome, bilateral upper limbs: Secondary | ICD-10-CM | POA: Diagnosis not present

## 2023-05-29 DIAGNOSIS — G5602 Carpal tunnel syndrome, left upper limb: Secondary | ICD-10-CM | POA: Diagnosis not present

## 2023-05-29 DIAGNOSIS — G5601 Carpal tunnel syndrome, right upper limb: Secondary | ICD-10-CM | POA: Diagnosis not present

## 2023-05-29 NOTE — Progress Notes (Unsigned)
Laura Burns - 52 y.o. female MRN [id number]  Date of birth: [birth year]  Office Visit Note: Visit Date: 05/29/2023 PCP: [name], MD (Inactive) Referred by: No ref. provider found  Subjective: No chief complaint on file.  HPI: Laura Burns is a pleasant 52 y.o. female who presents today for follow-up of bilateral hand complaints.  She is describing numbness and tingling of the radial sided digits of the left hand which is present now for multiple years, worsening in nature with associated nocturnal symptoms.  She has history of prior right sided carpal tunnel release done 20+ years ago in [place], she has recently noticed recurrence of her prior carpal tunnel symptoms on that side as well with numbness and tingling and nocturnal symptoms.  Pertinent ROS were reviewed with the patient and found to be negative unless otherwise specified above in HPI.     Assessment & Plan: Visit Diagnoses:  No diagnosis found.   Plan: Extensive discussion was had the patient today regarding her bilateral hand complaints.  Her left side does demonstrate both clinical and electrodiagnostic evidence of carpal tunnel syndrome that is refractory to conservative care in the form of activity modification and bracing.  We discussed conservative versus surgical treatment options for the left side.  From a conservative standpoint, we discussed ongoing bracing, injections and potential therapy.  At this juncture, given her refractory symptoms, she is interested in cortisone injection for diagnostic and therapeutic benefit.  She was initially planned for left open carpal tunnel release surgery, however she would like to postpone this for the time being.  For the right hand, she is demonstrating clinical signs and symptoms consistent with mild recurrent carpal tunnel syndrome.  I reviewed the results of her electrodiagnostic study which are also relatively mild in nature.  For the time being, we will continue with  bracing and activity modification.  She did undergo a prior cortisone injection to the right sided carpal tunnel at her prior visit.    Follow-up: No follow-ups on file.   Meds & Orders: No orders of the defined types were placed in this encounter.   No orders of the defined types were placed in this encounter.    Procedures: Hand/UE Inj: L carpal tunnel for carpal tunnel syndrome on 05/31/2023 12:18 PM Indications: therapeutic Details: 25 G needle, volar approach Medications: 1 mL lidocaine 1 %; 6 mg betamethasone acetate-betamethasone sodium phosphate 6 (3-3) MG/ML Outcome: tolerated well, no immediate complications         Clinical History: No specialty comments available.  She reports that she has quit smoking. She has never used smokeless tobacco. No results for input(s): "HGBA1C", "LABURIC" in the last 8760 hours.  Objective:   Vital Signs: There were no vitals taken for this visit.  Physical Exam  Gen: Well-appearing, in no acute distress; non-toxic CV: Regular Rate. Well-perfused. Warm.  Resp: Breathing unlabored on room air; no wheezing. Psych: Fluid speech in conversation; appropriate affect; normal thought process  Ortho Exam PHYSICAL EXAM:  General: Patient is well appearing and in no distress. Cervical spine mobility is full in all directions:  Skin and Muscle: Prior well-healed right carpal tunnel incision, palmar aspect right hand  Range of Motion and Palpation Tests: Mobility is full about the elbows with flexion and extension. Forearm supination and pronation are 85/85 bilaterally.  Wrist flexion/extension is 75/65 bilaterally.  Digital flexion and extension are full.  Thumb opposition is full to the base of the small fingers bilaterally.  No cords or nodules are palpated.  No triggering is observed.     Neurologic, Vascular, Motor: Sensation is diminished to light touch in the bilateral median nerve distributions.   2-point discrimination left  side median nerve 9 mm.   2 point discrimination right side median nerve between 7 and 8 mm.   Tinel's testing positive bilateral carpal tunnel Phalen's positive bilateral, Derkan's compression positive bilateral Thenar atrophy: Negative APB: 5/5 bilateral  Fingers pink and well perfused.  Capillary refill is brisk.     No results found for: "HGBA1C"   Imaging: No results found.  Past Medical/Family/Surgical/Social History: Medications & Allergies reviewed per EMR, new medications updated. Patient Active Problem List   Diagnosis Date Noted   Hyperlipidemia 07/28/2022   Impaired glucose tolerance 07/28/2022   Tobacco abuse 05/15/2022   Carpal tunnel syndrome of left wrist 10/31/2021   Moderate persistent asthma 05/13/2020   Hypertension    Acute asthma exacerbation 05/12/2020   Elevated cholesterol with high triglycerides 02/13/2020   Prediabetes 02/13/2020   Vitamin D deficiency 02/13/2020   Arm pain, anterior, left 02/06/2020   BMI 29.0-29.9,adult 02/06/2020   Allergies 07/19/2019   Allergic rhinitis 09/29/2018   Depression 09/29/2018   Encounter for wellness examination in adult 09/29/2018   Irregular menstruation 09/29/2018   Mitral valve prolapse 09/29/2018   Nicotine dependence, uncomplicated 09/29/2018   Past Medical History:  Diagnosis Date   Asthma    Hypertension    MVP (mitral valve prolapse)    Family History  Problem Relation Age of Onset   Diabetes Mother    Hypothyroidism Mother    Diabetes Father    Past Surgical History:  Procedure Laterality Date   CESAREAN SECTION     EAR CYST EXCISION     Social History   Occupational History   Not on file  Tobacco Use   Smoking status: Former   Smokeless tobacco: Never   Tobacco comments:    "when I'm drinking"  Substance and Sexual Activity   Alcohol use: Yes    Comment: socially   Drug use: Never   Sexual activity: Not on file    Micca Matura Fara Boros) Denese Killings, M.D. [facility] 12:16  PM

## 2023-05-31 DIAGNOSIS — G5602 Carpal tunnel syndrome, left upper limb: Secondary | ICD-10-CM | POA: Diagnosis not present

## 2023-05-31 MED ORDER — LIDOCAINE HCL 1 % IJ SOLN
1.0000 mL | INTRAMUSCULAR | Status: AC | PRN
  Administered 2023-05-31: 1 mL

## 2023-05-31 MED ORDER — BETAMETHASONE SOD PHOS & ACET 6 (3-3) MG/ML IJ SUSP
6.0000 mg | INTRAMUSCULAR | Status: AC | PRN
  Administered 2023-05-31: 6 mg via INTRA_ARTICULAR

## 2023-06-10 ENCOUNTER — Ambulatory Visit: Payer: Medicaid Other | Admitting: Obstetrics and Gynecology

## 2023-06-10 ENCOUNTER — Encounter: Payer: Self-pay | Admitting: Obstetrics and Gynecology

## 2023-06-10 ENCOUNTER — Other Ambulatory Visit (HOSPITAL_COMMUNITY)
Admission: RE | Admit: 2023-06-10 | Discharge: 2023-06-10 | Disposition: A | Discharge status: Home / Self Care | Payer: Medicaid Other | Source: Ambulatory Visit | Attending: Obstetrics and Gynecology | Admitting: Obstetrics and Gynecology

## 2023-06-10 VITALS — BP 136/88 | HR 82 | Ht 61.5 in | Wt 169.4 lb

## 2023-06-10 DIAGNOSIS — N939 Abnormal uterine and vaginal bleeding, unspecified: Secondary | ICD-10-CM | POA: Insufficient documentation

## 2023-06-10 DIAGNOSIS — Z124 Encounter for screening for malignant neoplasm of cervix: Secondary | ICD-10-CM | POA: Diagnosis present

## 2023-06-10 DIAGNOSIS — Z1231 Encounter for screening mammogram for malignant neoplasm of breast: Secondary | ICD-10-CM

## 2023-06-10 DIAGNOSIS — N950 Postmenopausal bleeding: Secondary | ICD-10-CM

## 2023-06-10 DIAGNOSIS — Z1211 Encounter for screening for malignant neoplasm of colon: Secondary | ICD-10-CM | POA: Diagnosis not present

## 2023-06-10 NOTE — Progress Notes (Signed)
Pt present for AUB, Pt reports no periods in 4 years. Pt spotting for 2 days, 2 months ago. Hx of abnormal PAP and ovarian cyst. Last PAP 2020 No mammogram or colonoscopy

## 2023-06-10 NOTE — Progress Notes (Signed)
GYNECOLOGY OFFICE NOTE  History:  52 y.o. [id number] here today for two days of dark brown spotting for 2 days about 2-3 months ago. Went through menopause 4 years ago and has not had any spotting since. None since.   H/o abnormal pap just prior to covid and has not had any follow up since.    Past Medical History:  Diagnosis Date   Asthma    Diabetes (HCC)    Hypertension    MVP (mitral valve prolapse)     Past Surgical History:  Procedure Laterality Date   carpel tunnal     CESAREAN SECTION     EAR CYST EXCISION     TUBAL LIGATION       Current Outpatient Medications:    albuterol (PROVENTIL) (2.5 MG/3ML) 0.083% nebulizer solution, Inhale 1 vial via nebulizer every 6 hours as needed., Disp: 75 mL, Rfl: 3   albuterol (VENTOLIN HFA) 108 (90 Base) MCG/ACT inhaler, Inhale 2 puffs into the lungs every 6 hours as needed for wheezing or shortness of breath., Disp: 18 g, Rfl: 3   amLODipine (NORVASC) 10 MG tablet, Take 1 tablet by mouth daily., Disp: , Rfl:    fluticasone (FLONASE) 50 MCG/ACT nasal spray, Place 2 sprays into both nostrils daily., Disp: 16 g, Rfl: 6   fluticasone-salmeterol (ADVAIR HFA) 230-21 MCG/ACT inhaler, Inhale 2 puffs into the lungs 2 (two) times daily., Disp: 1 each, Rfl: 12   levocetirizine (XYZAL) 5 MG tablet, Take 5 mg by mouth every evening., Disp: , Rfl:    benzonatate (TESSALON) 100 MG capsule, Take 1 capsule (100 mg total) by mouth 3 (three) times daily as needed., Disp: 30 capsule, Rfl: 0   montelukast (SINGULAIR) 10 MG tablet, TAKE 1 TABLET (10 MG TOTAL) BY MOUTH AT BEDTIME. (Patient not taking: Reported on 04/27/2021), Disp: 30 tablet, Rfl: 2  The following portions of the patient's history were reviewed and updated as appropriate: allergies, current medications, past family history, past medical history, past social history, past surgical history and problem list.   Review of Systems:  Pertinent items noted in HPI and remainder of comprehensive ROS  otherwise negative.   Objective:  Physical Exam BP 136/88   Pulse 82   Ht 5' 1.5" (1.562 m)   Wt 169 lb 6.4 oz (76.8 kg)   BMI 31.49 kg/m  CONSTITUTIONAL: Well-developed, well-nourished female in no acute distress.  HENT:  Normocephalic, atraumatic. External right and left ear normal. Oropharynx is clear and moist EYES: Conjunctivae and EOM are normal. Pupils are equal, round, and reactive to light. No scleral icterus.  NECK: Normal range of motion, supple, no masses SKIN: Skin is warm and dry. No rash noted. Not diaphoretic. No erythema. No pallor. NEUROLOGIC: Alert and oriented to person, place, and time. Normal reflexes, muscle tone coordination. No cranial nerve deficit noted. PSYCHIATRIC: Normal mood and affect. Normal behavior. Normal judgment and thought content. CARDIOVASCULAR: Normal heart rate noted RESPIRATORY: Effort normal, no problems with respiration noted ABDOMEN: Soft, no distention noted.   PELVIC: Normal appearing external genitalia; mildly atrophic appearing vaginal mucosa and cervix.  No abnormal discharge noted.  Pap smear obtained, small amount bleeding noted with pap.  pelvic cultures obtained. Normal uterine size, no other palpable masses, no uterine or adnexal tenderness. MUSCULOSKELETAL: Normal range of motion. No edema noted.  Exam done with chaperone present.  Labs and Imaging No results found.  Assessment & Plan:  1. Cervical cancer screening - Cytology - PAP( )  2.  Encounter for screening mammogram for malignant neoplasm of breast - MM 3D SCREENING MAMMOGRAM BILATERAL BREAST; Future  3. Abnormal uterine bleeding (AUB) - Cervicovaginal ancillary only( [place])  4. Colon cancer screening - Ambulatory referral to Gastroenterology  5. Post-menopausal bleeding Patient with one episode of dark bleeding, none since. Reviewed that it could be atrophy vs benign uterine process vs malignancy, recommend TVUS to evaluate uterine lining with  possible need for subsequent Endometrial biopsy based on results vs proceeding with EMB, reviewed risks/benefits of each. She would like to proceed with TVUS with subsequent EMB if needed, this is reasonable, she understands risks/benefits of plan - US PELVIC COMPLETE WITH TRANSVAGINAL; Future   Routine preventative health maintenance measures emphasized. Please refer to After Visit Summary for other counseling recommendations.   Return for will contact patient with results for follow up.   Baldemar Lenis, MD, [facility] Attending Center for [employer] [facility])

## 2023-06-11 LAB — CERVICOVAGINAL ANCILLARY ONLY
Bacterial Vaginitis (gardnerella): NEGATIVE
Candida Glabrata: NEGATIVE
Candida Vaginitis: NEGATIVE
Chlamydia: NEGATIVE
Comment: NEGATIVE
Comment: NEGATIVE
Comment: NEGATIVE
Comment: NEGATIVE
Comment: NEGATIVE
Comment: NORMAL
Neisseria Gonorrhea: NEGATIVE
Trichomonas: NEGATIVE

## 2023-06-12 LAB — CYTOLOGY - PAP
Comment: NEGATIVE
Diagnosis: NEGATIVE
High risk HPV: NEGATIVE

## 2023-07-10 ENCOUNTER — Ambulatory Visit
Admission: RE | Admit: 2023-07-10 | Discharge: 2023-07-10 | Disposition: A | Discharge status: Home / Self Care | Payer: Medicaid Other | Source: Ambulatory Visit | Attending: Obstetrics and Gynecology | Admitting: Obstetrics and Gynecology

## 2023-07-10 ENCOUNTER — Ambulatory Visit: Payer: Medicaid Other | Admitting: Orthopedic Surgery

## 2023-07-10 DIAGNOSIS — Z1231 Encounter for screening mammogram for malignant neoplasm of breast: Secondary | ICD-10-CM

## 2023-10-05 ENCOUNTER — Other Ambulatory Visit: Payer: Self-pay | Admitting: Nurse Practitioner

## 2023-10-05 DIAGNOSIS — J069 Acute upper respiratory infection, unspecified: Secondary | ICD-10-CM

## 2023-10-08 ENCOUNTER — Encounter (HOSPITAL_COMMUNITY): Payer: Self-pay

## 2023-10-08 ENCOUNTER — Other Ambulatory Visit: Payer: Self-pay

## 2023-10-08 ENCOUNTER — Emergency Department (HOSPITAL_COMMUNITY)
Admission: EM | Admit: 2023-10-08 | Discharge: 2023-10-08 | Disposition: A | Discharge status: Home / Self Care | Attending: Emergency Medicine | Admitting: Emergency Medicine

## 2023-10-08 ENCOUNTER — Emergency Department (HOSPITAL_COMMUNITY)

## 2023-10-08 DIAGNOSIS — J45909 Unspecified asthma, uncomplicated: Secondary | ICD-10-CM | POA: Diagnosis not present

## 2023-10-08 DIAGNOSIS — Z7951 Long term (current) use of inhaled steroids: Secondary | ICD-10-CM | POA: Insufficient documentation

## 2023-10-08 DIAGNOSIS — I10 Essential (primary) hypertension: Secondary | ICD-10-CM | POA: Insufficient documentation

## 2023-10-08 DIAGNOSIS — Z79899 Other long term (current) drug therapy: Secondary | ICD-10-CM | POA: Insufficient documentation

## 2023-10-08 DIAGNOSIS — E1165 Type 2 diabetes mellitus with hyperglycemia: Secondary | ICD-10-CM | POA: Diagnosis not present

## 2023-10-08 DIAGNOSIS — R1012 Left upper quadrant pain: Secondary | ICD-10-CM | POA: Insufficient documentation

## 2023-10-08 DIAGNOSIS — Z79890 Hormone replacement therapy (postmenopausal): Secondary | ICD-10-CM | POA: Diagnosis not present

## 2023-10-08 LAB — COMPREHENSIVE METABOLIC PANEL
ALT: 44 U/L (ref 0–44)
AST: 32 U/L (ref 15–41)
Albumin: 3.9 g/dL (ref 3.5–5.0)
Alkaline Phosphatase: 72 U/L (ref 38–126)
Anion gap: 10 (ref 5–15)
BUN: 15 mg/dL (ref 6–20)
CO2: 24 mmol/L (ref 22–32)
Calcium: 9.2 mg/dL (ref 8.9–10.3)
Chloride: 107 mmol/L (ref 98–111)
Creatinine, Ser: 0.66 mg/dL (ref 0.44–1.00)
GFR, Estimated: >60 mL/min (ref >60)
Glucose, Bld: 176 mg/dL — ABNORMAL HIGH (ref 70–99)
Potassium: 3.5 mmol/L (ref 3.5–5.1)
Sodium: 141 mmol/L (ref 135–145)
Total Bilirubin: 0.4 mg/dL (ref 0.0–1.2)
Total Protein: 6.7 g/dL (ref 6.5–8.1)

## 2023-10-08 LAB — URINALYSIS, ROUTINE W REFLEX MICROSCOPIC
Bacteria, UA: NONE SEEN
Bilirubin Urine: NEGATIVE
Glucose, UA: NEGATIVE mg/dL
Hgb urine dipstick: NEGATIVE
Ketones, ur: NEGATIVE mg/dL
Nitrite: NEGATIVE
Protein, ur: NEGATIVE mg/dL
Specific Gravity, Urine: 1.036 — ABNORMAL HIGH (ref 1.005–1.030)
pH: 6 (ref 5.0–8.0)

## 2023-10-08 LAB — CBC
HCT: 43.2 % (ref 36.0–46.0)
Hemoglobin: 14.6 g/dL (ref 12.0–15.0)
MCH: 30.7 pg (ref 26.0–34.0)
MCHC: 33.8 g/dL (ref 30.0–36.0)
MCV: 90.9 fL (ref 80.0–100.0)
Platelets: 266 10*3/uL (ref 150–400)
RBC: 4.75 MIL/uL (ref 3.87–5.11)
RDW: 12.3 % (ref 11.5–15.5)
WBC: 5.4 10*3/uL (ref 4.0–10.5)
nRBC: 0 % (ref 0.0–0.2)

## 2023-10-08 LAB — LIPASE, BLOOD: Lipase: 45 U/L (ref 11–51)

## 2023-10-08 LAB — D-DIMER, QUANTITATIVE: D-Dimer, Quant: <0.27 ug{FEU}/mL (ref 0.00–0.50)

## 2023-10-08 MED ORDER — ONDANSETRON HCL 4 MG/2ML IJ SOLN
4.0000 mg | Freq: Once | INTRAMUSCULAR | Status: AC
  Administered 2023-10-08: 4 mg via INTRAVENOUS
  Filled 2023-10-08: qty 2

## 2023-10-08 MED ORDER — IOHEXOL 300 MG/ML  SOLN
100.0000 mL | Freq: Once | INTRAMUSCULAR | Status: AC | PRN
  Administered 2023-10-08: 100 mL via INTRAVENOUS

## 2023-10-08 MED ORDER — FENTANYL CITRATE PF 50 MCG/ML IJ SOSY
50.0000 ug | PREFILLED_SYRINGE | Freq: Once | INTRAMUSCULAR | Status: AC
  Administered 2023-10-08: 50 ug via INTRAVENOUS
  Filled 2023-10-08: qty 1

## 2023-10-08 MED ORDER — LIDOCAINE VISCOUS HCL 2 % MT SOLN
15.0000 mL | Freq: Once | OROMUCOSAL | Status: AC
  Administered 2023-10-08: 15 mL via ORAL
  Filled 2023-10-08: qty 15

## 2023-10-08 MED ORDER — PANTOPRAZOLE SODIUM 40 MG IV SOLR
40.0000 mg | Freq: Once | INTRAVENOUS | Status: AC
  Administered 2023-10-08: 40 mg via INTRAVENOUS
  Filled 2023-10-08: qty 10

## 2023-10-08 MED ORDER — LIDOCAINE 5 % EX PTCH
1.0000 | MEDICATED_PATCH | CUTANEOUS | 0 refills | Status: AC
  Filled 2023-10-08: qty 30, 30d supply, fill #0

## 2023-10-08 MED ORDER — ALUM & MAG HYDROXIDE-SIMETH 200-200-20 MG/5ML PO SUSP
30.0000 mL | Freq: Once | ORAL | Status: AC
  Administered 2023-10-08: 30 mL via ORAL
  Filled 2023-10-08: qty 30

## 2023-10-08 NOTE — Discharge Instructions (Addendum)
 Your workup appears reassuring today.  You did not have any abnormalities in your liver, kidney, or pancreas labs.  Your electrolytes were normal.  Your blood counts were normal.  Your D-dimer was within normal limits, which means a blood clot is very unlikely.  Your urine did not show any signs of infection.  The CT of your abdomen did not show any abnormalities to explain your pain.  There is diverticulosis noted on your CT.  Please make your PCP aware of this finding.  Your blood sugar was slightly elevated today at 176.  Please have your PCP continue to monitor your blood sugars as this could be a sign of diabetes.  You may take up to 1000mg  of tylenol every 6 hours as needed for pain.  Do not take more then 4g per day.  You may use up to 600mg  ibuprofen every 6 hours as needed for pain.  Do not exceed 2.4g of ibuprofen per day.  You have also been prescribed lidocaine patches to use on your side to help with pain.  You may apply 1 patch for up to 12 hours, then you must remove the patch for full 12 hours for reapplying a new patch.  Return to the ER for any severe worsening of your pain, uncontrolled vomiting, any other new or concerning symptoms.

## 2023-10-08 NOTE — ED Provider Notes (Signed)
 [place] EMERGENCY DEPARTMENT AT [facility] Provider Note   CSN: [id number] Arrival date & time: 10/08/23  1418     History  Chief Complaint  Patient presents with   Abdominal Pain    Laura Burns is a 53 y.o. female with history of asthma, hypertension, diabetes, presents with concern for left upper quadrant pain that started about 3 days ago.  States the pain is worse when she coughs or takes a deep breath in.  She states the pain is not present when she lies still. Denies any trauma to the area.  Reports the pain has been gradually getting worse over the past couple of days.  Denies any fevers, chills, cough, nausea, or vomiting.  Denies any hematuria, dysuria, or increased frequency.   Abdominal Pain      Home Medications Prior to Admission medications   Medication Sig Start Date End Date Taking? Authorizing Provider  lidocaine (LIDODERM) 5 % Place 1 patch onto the skin daily. Apply patch to skin for up to 12 hours as needed for pain.  Remove patch for full 12 hours before reapplying a new patch. 10/08/23  Yes Arabella Merles, PA-C  albuterol (PROVENTIL) (2.5 MG/3ML) 0.083% nebulizer solution Inhale 1 vial via nebulizer every 6 hours as needed. 07/18/22   Margaretann Loveless, PA-C  albuterol (VENTOLIN HFA) 108 (90 Base) MCG/ACT inhaler Inhale 2 puffs into the lungs every 6 hours as needed for wheezing or shortness of breath. 01/06/22   Olalere, Onnie Boer A, MD  amLODipine (NORVASC) 10 MG tablet Take 1 tablet by mouth daily.    [provider]  fluticasone (FLONASE) 50 MCG/ACT nasal spray Place 2 sprays into both nostrils daily. 07/28/22   Viviano Simas, FNP  fluticasone-salmeterol (ADVAIR HFA) [phone] MCG/ACT inhaler Inhale 2 puffs into the lungs 2 (two) times daily. 04/23/21   Olalere, Onnie Boer A, MD  levocetirizine (XYZAL) 5 MG tablet Take 5 mg by mouth every evening.    [provider]      Allergies    Egg-derived products, Gluten meal, Milk (cow),  Pollen extract, Wheat, Dust mite extract, and Tilactase    Review of Systems   Review of Systems  Gastrointestinal:  Positive for abdominal pain.    Physical Exam Updated Vital Signs BP 115/76 (BP Location: Left Arm)   Pulse 77   Temp 98.5 F (36.9 C) (Oral)   Resp 16   Ht 5\' 1"  (1.549 m)   Wt 76.8 kg   SpO2 94%   BMI 31.99 kg/m  Physical Exam Vitals and nursing note reviewed.  Constitutional:      General: She is not in acute distress.    Appearance: She is well-developed.  HENT:     Head: Normocephalic and atraumatic.  Eyes:     Conjunctiva/sclera: Conjunctivae normal.  Cardiovascular:     Rate and Rhythm: Normal rate and regular rhythm.     Heart sounds: No murmur heard. Pulmonary:     Effort: Pulmonary effort is normal. No respiratory distress.     Breath sounds: Normal breath sounds.     Comments: Reports pain with deep breaths   Mild wheezing in the lung bases bilaterally Abdominal:     Palpations: Abdomen is soft.     Tenderness: There is no abdominal tenderness. There is no right CVA tenderness, left CVA tenderness or rebound.  Musculoskeletal:        General: No swelling.     Cervical back: Neck supple.  Skin:  General: Skin is warm and dry.     Capillary Refill: Capillary refill takes less than 2 seconds.     Comments: No rash  Neurological:     Mental Status: She is alert.  Psychiatric:        Mood and Affect: Mood normal.     ED Results / Procedures / Treatments   Labs (all labs ordered are listed, but only abnormal results are displayed) Labs Reviewed  COMPREHENSIVE METABOLIC PANEL - Abnormal; Notable for the following components:      Result Value   Glucose, Bld 176 (*)    All other components within normal limits  URINALYSIS, ROUTINE W REFLEX MICROSCOPIC - Abnormal; Notable for the following components:   Specific Gravity, Urine 1.036 (*)    Leukocytes,Ua TRACE (*)    All other components within normal limits  LIPASE, BLOOD  CBC   D-DIMER, QUANTITATIVE    EKG None  Radiology CT ABDOMEN PELVIS W CONTRAST Result Date: 10/08/2023 CLINICAL DATA:  Left-sided abdominal pain. EXAM: CT ABDOMEN AND PELVIS WITH CONTRAST TECHNIQUE: Multidetector CT imaging of the abdomen and pelvis was performed using the standard protocol following bolus administration of intravenous contrast. RADIATION DOSE REDUCTION: This exam was performed according to the departmental dose-optimization program which includes automated exposure control, adjustment of the mA and/or kV according to patient size and/or use of iterative reconstruction technique. CONTRAST:  OMNIPAQUE IOHEXOL 300 MG/ML  SOLN COMPARISON:  None Available. FINDINGS: Lower chest: Minimal bibasilar linear atelectasis/scarring. The visualized lung bases are otherwise clear. No intra-abdominal free air or free fluid. Hepatobiliary: No focal liver abnormality is seen. No gallstones, gallbladder wall thickening, or biliary dilatation. Pancreas: Unremarkable. No pancreatic ductal dilatation or surrounding inflammatory changes. Spleen: Normal in size without focal abnormality. Adrenals/Urinary Tract: The adrenal glands unremarkable. There is no hydronephrosis on either side. There is symmetric enhancement and excretion of contrast by both kidneys. The visualized ureters and urinary bladder are unremarkable. Stomach/Bowel: Mild sigmoid diverticulosis without active inflammatory changes. There is moderate stool throughout the colon. No bowel obstruction or active inflammation. The appendix is normal. Vascular/Lymphatic: The abdominal aorta and IVC are unremarkable. No portal venous gas. There is no adenopathy. Reproductive: The uterus is anteverted and grossly unremarkable. No suspicious adnexal masses. Other: None Musculoskeletal: Degenerative changes and disc desiccation at L4-L5 and L5-S1. No acute osseous pathology. IMPRESSION: 1. No acute intra-abdominal or pelvic pathology. 2. Mild sigmoid  diverticulosis. No bowel obstruction. Normal appendix. Electronically Signed   By: Elgie Collard M.D.   On: 10/08/2023 17:28    Procedures Procedures    Medications Ordered in ED Medications  fentaNYL (SUBLIMAZE) injection 50 mcg (50 mcg Intravenous Given 10/08/23 1642)  ondansetron (ZOFRAN) injection 4 mg (4 mg Intravenous Given 10/08/23 1640)  iohexol (OMNIPAQUE) 300 MG/ML solution 100 mL (100 mLs Intravenous Contrast Given 10/08/23 1705)  alum & mag hydroxide-simeth (MAALOX/MYLANTA) 200-200-20 MG/5ML suspension 30 mL (30 mLs Oral Given 10/08/23 1852)    And  lidocaine (XYLOCAINE) 2 % viscous mouth solution 15 mL (15 mLs Oral Given 10/08/23 1852)  pantoprazole (PROTONIX) injection 40 mg (40 mg Intravenous Given 10/08/23 1853)    ED Course/ Medical Decision Making/ A&P                                 Medical Decision Making Amount and/or Complexity of Data Reviewed Labs: ordered. Radiology: ordered.  Risk OTC drugs. Prescription drug management.  Differential diagnosis includes but is not limited to Shingles, PE, pleural effusion Cholelithiasis, cholangitis, choledocholithiasis, peptic ulcer, gastritis, gastroenteritis, appendicitis, IBS, IBD, DKA, nephrolithiasis, UTI, pyelonephritis, pancreatitis, diverticulitis, mesenteric ischemia, abdominal aortic aneurysm, small bowel obstruction, volvulus   ED Course:  Patient well-appearing, no acute distress.  Stable vital signs.  She reports left-sided abdominal pain and back pain, ongoing for the past 3 days.  I do not appreciate any abdominal tenderness on palpation on exam, no CVA tenderness.  No obvious rashes/shingles. Patient given fentanyl, Zofran, Maalox, and Protonix for pain I Ordered, and personally interpreted labs.  The pertinent results include:   CBC unremarkable CMP with elevated glucose at 176, otherwise normal electrolytes, LFTs, and creatinine Urinalysis with high specific gravity, no nitrates.  Trace  leukocytes, but no white blood cells noted.  Squamous epithelial cells noted D-dimer within normal limits CT abdomen pelvis was obtained which showed no acute abnormality Upon re-evaluation, patient states her pain has resolved.  Unclear as to the etiology as to her pain, suspect this could have been musculoskeletal.  Given no abdominal tenderness palpation, CT scan unremarkable, and no elevation LFTs, creatinine, no leukocytosis, normal lipase, low concern for any acute intra-abdominal pathology at this time.  No signs of UTI.  D-dimer within normal limits, low concern for a PE. No indication for further workup or hospitalization at this time.  Patient stable and appropriate for discharge home this time  Impression: Left-sided abdominal/back pain, unclear etiology.  Most likely musculoskeletal  Disposition:  The patient was discharged home with instructions to take Tylenol and ibuprofen as needed for pain.  Lidocaine patches as prescribed as needed for pain.  Follow-up with PCP for recheck of symptoms.  She understands her glucose was elevated today, and that her PCP should continue to monitor this. Return precautions given.  Imaging Studies ordered: I ordered imaging studies including CT abdomen and pelvis I independently visualized the imaging with scope of interpretation limited to determining acute life threatening conditions related to emergency care. Imaging showed  No acute abnormalities.  Diverticulosis noted I agree with the radiologist interpretation   Cardiac Monitoring: / EKG: The patient was maintained on a cardiac monitor.  I personally viewed and interpreted the cardiac monitored which showed an underlying rhythm of: Normal sinus rhythm               Final Clinical Impression(s) / ED Diagnoses Final diagnoses:  Left upper quadrant abdominal pain    Rx / DC Orders ED Discharge Orders          Ordered    lidocaine (LIDODERM) 5 %  Every 24 hours         10/08/23 2110              Arabella Merles, PA-C 10/08/23 2120    Benjiman Core, MD 10/08/23 2303

## 2023-10-08 NOTE — ED Triage Notes (Signed)
 Patient is here for evaluation of pain on the left side. Reports that her left upper abdomen is where her pain starts and will radiate to her back. She also states the pain moves up into her neck and left arm. Reports weakness, nausea and dizziness.

## 2023-10-08 NOTE — ED Notes (Signed)
 Pt left prior to receiving discharge paperwork and education from this RN. Found IV in trash w/ catheter intact.

## 2023-10-09 ENCOUNTER — Other Ambulatory Visit: Payer: Self-pay

## 2023-10-14 ENCOUNTER — Other Ambulatory Visit: Payer: Self-pay

## 2024-05-23 ENCOUNTER — Encounter: Payer: Self-pay | Admitting: Radiology

## 2024-06-09 ENCOUNTER — Encounter: Payer: Self-pay | Admitting: Physician Assistant

## 2024-06-09 ENCOUNTER — Ambulatory Visit: Admitting: Physician Assistant

## 2024-06-09 VITALS — BP 124/82

## 2024-06-09 DIAGNOSIS — L308 Other specified dermatitis: Secondary | ICD-10-CM | POA: Diagnosis not present

## 2024-06-09 DIAGNOSIS — R21 Rash and other nonspecific skin eruption: Secondary | ICD-10-CM | POA: Diagnosis not present

## 2024-06-09 DIAGNOSIS — L301 Dyshidrosis [pompholyx]: Secondary | ICD-10-CM

## 2024-06-09 NOTE — Progress Notes (Addendum)
 New Patient Visit   Subjective  Laura Burns is a 53 y.o. female NEW PATIENT who presents for the following: Rash of hands and right foot. It comes up as little blisters and itches a lot. Her hands are dry and they crack and bleed and get sore. She uses TMC 0.1% oint 3-4 times per day. She uses it after washing hands. She was given a small tube of clobetasol  ointment but it did not last long. Urgent Care also gave her an IM injection and prednisone  20 mg 2 tablets daily for 5 days.She was given Ketoconazole for her foot but it has not helped. The steroid ointments seem to be making it worse. It started 3 weeks ago on her right foot then the right hand and spread to her left hand.  Works as a CLINICAL BIOCHEMIST at [employer].     The following portions of the chart were reviewed this encounter and updated as appropriate: medications, allergies, medical history  Review of Systems:  No other skin or systemic complaints except as noted in HPI or Assessment and Plan.  Objective  Well appearing patient in no apparent distress; mood and affect are within normal limits.   A focused examination was performed of the following areas: Face, arms, hands, legs and feet.    Relevant exam findings are noted in the Assessment and Plan.    Assessment & Plan   RASH -- HANDS AND RIGHT FOOT Exam: erythema, scale and fissures with moccasin-like rash on right foot; Left foot WNL  Plan: punch biopsy x 2 (hand / foot) - Ddx: tinea vs: psoriasis vs: PPK  KOH performed but not definitive for fungus  Patient to start topical Lamisil  until path report back. STOP topical steroids    RASH Right Foot, Right Hand Skin / nail biopsy - Right Foot Type of biopsy: punch   Informed consent: discussed and consent obtained   Timeout: patient name, date of birth, surgical site, and procedure verified   Procedure prep:  Patient was prepped and draped in usual sterile fashion (the patient was cleaned and prepped) Prep type:  Isopropyl  alcohol Anesthesia: the lesion was anesthetized in a standard fashion   Anesthetic:  1% lidocaine  w/ epinephrine 1-100,000 buffered w/ 8.4% NaHCO3 Punch size:  4 mm Hemostasis achieved with: pressure and Gelfoam   Outcome: patient tolerated procedure well   Post-procedure details: sterile dressing applied and wound care instructions given   Dressing type: bandage, petrolatum and pressure dressing    Skin / nail biopsy - Right Hand Type of biopsy: punch   Informed consent: discussed and consent obtained   Timeout: patient name, date of birth, surgical site, and procedure verified   Procedure prep:  Patient was prepped and draped in usual sterile fashion (the patient was cleaned and prepped) Prep type:  Isopropyl alcohol Anesthesia: the lesion was anesthetized in a standard fashion   Anesthetic:  1% lidocaine  w/ epinephrine 1-100,000 buffered w/ 8.4% NaHCO3 Punch size:  4 mm Hemostasis achieved with: pressure and Gelfoam   Outcome: patient tolerated procedure well   Post-procedure details: sterile dressing applied and wound care instructions given   Dressing type: bandage, petrolatum and pressure dressing    Specimen 1 - Surgical pathology Differential Diagnosis: Tinea vs Psoriasis vs PPK vs other   Check Margins: No  Specimen 2 - Surgical pathology Differential Diagnosis: Tinea vs Psoriasis vs PPK vs other   Check Margins: No  Return for pending pathology .  I, Roseline Hutchinson, CMA, am  acting as scribe for Kenika Sahm K, PA-C .   Documentation: I have reviewed the above documentation for accuracy and completeness, and I agree with the above.  Nabil Bubolz K, PA-C

## 2024-06-09 NOTE — Patient Instructions (Signed)

## 2024-06-10 LAB — SURGICAL PATHOLOGY

## 2024-06-12 ENCOUNTER — Ambulatory Visit: Payer: Self-pay | Admitting: Physician Assistant

## 2024-06-12 ENCOUNTER — Other Ambulatory Visit: Payer: Self-pay | Admitting: Physician Assistant

## 2024-06-12 DIAGNOSIS — L309 Dermatitis, unspecified: Secondary | ICD-10-CM

## 2024-06-12 MED ORDER — TERBINAFINE HCL 250 MG PO TABS: 250.0000 mg | ORAL_TABLET | Freq: Every day | ORAL | Status: AC

## 2024-06-12 NOTE — Progress Notes (Signed)
 See path report

## 2024-06-13 ENCOUNTER — Other Ambulatory Visit: Payer: Self-pay

## 2024-06-13 DIAGNOSIS — L308 Other specified dermatitis: Secondary | ICD-10-CM

## 2024-06-13 MED ORDER — CLOBETASOL PROPIONATE 0.05 % EX OINT: 1.0000 | TOPICAL_OINTMENT | Freq: Two times a day (BID) | CUTANEOUS | 1 refills | Status: DC

## 2024-06-13 NOTE — Addendum Note (Signed)
 Addended by: ADDIE SMOKER B on: 06/13/2024 02:08 PM   Modules accepted: Orders

## 2024-06-21 ENCOUNTER — Encounter: Payer: Self-pay | Admitting: Physician Assistant

## 2024-07-18 ENCOUNTER — Ambulatory Visit: Admitting: Physician Assistant

## 2024-07-31 ENCOUNTER — Other Ambulatory Visit: Payer: Self-pay

## 2024-07-31 ENCOUNTER — Encounter (HOSPITAL_COMMUNITY): Payer: Self-pay | Admitting: Emergency Medicine

## 2024-07-31 ENCOUNTER — Emergency Department (HOSPITAL_COMMUNITY)
Admission: EM | Admit: 2024-07-31 | Discharge: 2024-07-31 | Disposition: A | Discharge status: Home / Self Care | Attending: Emergency Medicine | Admitting: Emergency Medicine

## 2024-07-31 DIAGNOSIS — L03115 Cellulitis of right lower limb: Secondary | ICD-10-CM | POA: Insufficient documentation

## 2024-07-31 DIAGNOSIS — R21 Rash and other nonspecific skin eruption: Secondary | ICD-10-CM | POA: Diagnosis present

## 2024-07-31 LAB — COMPREHENSIVE METABOLIC PANEL WITH GFR
ALT: 62 U/L — ABNORMAL HIGH (ref 0–44)
AST: 37 U/L (ref 15–41)
Albumin: 3.9 g/dL (ref 3.5–5.0)
Alkaline Phosphatase: 75 U/L (ref 38–126)
Anion gap: 11 (ref 5–15)
BUN: 11 mg/dL (ref 6–20)
CO2: 24 mmol/L (ref 22–32)
Calcium: 9 mg/dL (ref 8.9–10.3)
Chloride: 107 mmol/L (ref 98–111)
Creatinine, Ser: 0.77 mg/dL (ref 0.44–1.00)
GFR, Estimated: >60 mL/min
Glucose, Bld: 179 mg/dL — ABNORMAL HIGH (ref 70–99)
Potassium: 3.8 mmol/L (ref 3.5–5.1)
Sodium: 142 mmol/L (ref 135–145)
Total Bilirubin: <0.2 mg/dL (ref 0.0–1.2)
Total Protein: 6.2 g/dL — ABNORMAL LOW (ref 6.5–8.1)

## 2024-07-31 LAB — CBC WITH DIFFERENTIAL/PLATELET
Abs Immature Granulocytes: 0.02 K/uL (ref 0.00–0.07)
Basophils Absolute: 0 K/uL (ref 0.0–0.1)
Basophils Relative: 0 %
Eosinophils Absolute: 0.5 K/uL (ref 0.0–0.5)
Eosinophils Relative: 8 %
HCT: 43.5 % (ref 36.0–46.0)
Hemoglobin: 14.1 g/dL (ref 12.0–15.0)
Immature Granulocytes: 0 %
Lymphocytes Relative: 37 %
Lymphs Abs: 2.5 K/uL (ref 0.7–4.0)
MCH: 29.6 pg (ref 26.0–34.0)
MCHC: 32.4 g/dL (ref 30.0–36.0)
MCV: 91.2 fL (ref 80.0–100.0)
Monocytes Absolute: 0.6 K/uL (ref 0.1–1.0)
Monocytes Relative: 9 %
Neutro Abs: 3 K/uL (ref 1.7–7.7)
Neutrophils Relative %: 46 %
Platelets: 256 K/uL (ref 150–400)
RBC: 4.77 MIL/uL (ref 3.87–5.11)
RDW: 12.8 % (ref 11.5–15.5)
WBC: 6.7 K/uL (ref 4.0–10.5)
nRBC: 0 % (ref 0.0–0.2)

## 2024-07-31 MED ORDER — CEPHALEXIN 500 MG PO CAPS: 500.0000 mg | ORAL_CAPSULE | Freq: Four times a day (QID) | ORAL | 0 refills | Status: AC

## 2024-07-31 MED ORDER — FAMOTIDINE IN NACL 20-0.9 MG/50ML-% IV SOLN
20.0000 mg | Freq: Once | INTRAVENOUS | Status: AC
  Administered 2024-07-31: 20 mg via INTRAVENOUS
  Filled 2024-07-31: qty 50

## 2024-07-31 MED ORDER — DIPHENHYDRAMINE HCL 25 MG PO CAPS
50.0000 mg | ORAL_CAPSULE | Freq: Once | ORAL | Status: AC
  Administered 2024-07-31: 50 mg via ORAL
  Filled 2024-07-31: qty 2

## 2024-07-31 MED ORDER — SODIUM CHLORIDE 0.9 % IV SOLN
1.0000 g | Freq: Once | INTRAVENOUS | Status: AC
  Administered 2024-07-31: 1 g via INTRAVENOUS
  Filled 2024-07-31: qty 10

## 2024-07-31 MED ORDER — ACETAMINOPHEN 500 MG PO TABS
1000.0000 mg | ORAL_TABLET | Freq: Once | ORAL | Status: AC
  Administered 2024-07-31: 1000 mg via ORAL
  Filled 2024-07-31: qty 2

## 2024-07-31 MED ORDER — SODIUM CHLORIDE 0.9 % IV BOLUS
1000.0000 mL | Freq: Once | INTRAVENOUS | Status: AC
  Administered 2024-07-31: 1000 mL via INTRAVENOUS

## 2024-07-31 MED ORDER — FAMOTIDINE 20 MG PO TABS: 20.0000 mg | ORAL_TABLET | Freq: Two times a day (BID) | ORAL | 0 refills | Status: AC

## 2024-07-31 NOTE — Discharge Instructions (Signed)
Please take all of your antibiotics until finished!   You may develop abdominal discomfort or diarrhea from the antibiotic.  You may help offset this with probiotics which you can buy or get in yogurt. Do not eat  or take the probiotics until 2 hours after your antibiotic.  ° ° ° °

## 2024-07-31 NOTE — ED Triage Notes (Addendum)
 Pt reports rt foot pain, redness, itching & swelling. Also c/o same rash in her bilateral hands, back and breast. Reports this has been ongoing x 1 month. Pt rt foot noted to be scaly, red and inflamed.

## 2024-07-31 NOTE — ED Provider Notes (Signed)
 " [name] EMERGENCY DEPARTMENT AT [facility] Provider Note   CSN: [id number] Arrival date & time: 07/31/24  9370     Patient presents with: Rash   Laura Burns is a 54 y.o. female.    Rash Patient is a 54 year old female present emergency room today with complaints of right foot rash and redness that has been scaly and red and inflamed for the past few days.  She thinks 3 or 4.  She also states that she has had some on and off itching in her bilateral hands and abdomen for the past month.  No new medications.     Prior to Admission medications  Medication Sig Start Date End Date Taking? Authorizing Provider  cephALEXin  (KEFLEX ) 500 MG capsule Take 1 capsule (500 mg total) by mouth 4 (four) times daily. 07/31/24  Yes Hanish Laraia S, PA  famotidine  (PEPCID ) 20 MG tablet Take 1 tablet (20 mg total) by mouth 2 (two) times daily. 07/31/24  Yes Israel Wunder S, PA  albuterol  (PROVENTIL ) (2.5 MG/3ML) 0.083% nebulizer solution Inhale 1 vial via nebulizer every 6 hours as needed. 07/18/22   Vivienne Delon HERO, PA-C  albuterol  (VENTOLIN  HFA) 108 (90 Base) MCG/ACT inhaler Inhale 2 puffs into the lungs every 6 hours as needed for wheezing or shortness of breath. 01/06/22   Olalere, Jennet A, MD  amLODipine (NORVASC) 10 MG tablet Take 1 tablet by mouth daily.    [provider]  clobetasol  ointment (TEMOVATE ) 0.05 % Apply 1 Application topically 2 (two) times daily. Apply to hands and feet twice daily as needed 06/13/24   Sandridge, Brenda K, PA-C  fluticasone  (FLONASE ) 50 MCG/ACT nasal spray Place 2 sprays into both nostrils daily. 07/28/22   Kennyth Domino, FNP  fluticasone -salmeterol (ADVAIR  HFA) 230-21 MCG/ACT inhaler Inhale 2 puffs into the lungs 2 (two) times daily. 04/23/21   Olalere, Jennet A, MD  levocetirizine (XYZAL) 5 MG tablet Take 5 mg by mouth every evening.    [provider]  lidocaine  (LIDODERM ) 5 % Place 1 patch onto the skin daily. Apply patch to  skin for up to 12 hours as needed for pain.  Remove patch for full 12 hours before reapplying a new patch. 10/08/23   Veta Palma, PA-C    Allergies: Egg protein-containing drug products, Gluten meal, Milk (cow), Pollen extract, Wheat, Dust mite extract, and Tilactase    Review of Systems  Skin:  Positive for rash.    Updated Vital Signs BP (!) 136/93   Pulse 71   Temp (!) 97.4 F (36.3 C) (Oral)   Resp 18   SpO2 98%   Physical Exam Vitals and nursing note reviewed.  Constitutional:      General: She is not in acute distress. HENT:     Head: Normocephalic and atraumatic.     Nose: Nose normal.  Eyes:     General: No scleral icterus. Cardiovascular:     Rate and Rhythm: Normal rate and regular rhythm.     Pulses: Normal pulses.     Heart sounds: Normal heart sounds.  Pulmonary:     Effort: Pulmonary effort is normal. No respiratory distress.     Breath sounds: No wheezing.  Abdominal:     Palpations: Abdomen is soft.     Tenderness: There is no abdominal tenderness.  Musculoskeletal:     Cervical back: Normal range of motion.     Right lower leg: No edema.     Left lower leg: No edema.  Skin:    General: Skin is warm and dry.     Capillary Refill: Capillary refill takes less than 2 seconds.     Comments: Cellulitic appearing erythematous right lower extremity with sharp demarcation at the ankle with some friable skin of the dorsum of the foot but negative Nikolsky sign.  Cap refill less than 2 seconds DP PT pulses palpable  Neurological:     Mental Status: She is alert. Mental status is at baseline.  Psychiatric:        Mood and Affect: Mood normal.        Behavior: Behavior normal.     (all labs ordered are listed, but only abnormal results are displayed) Labs Reviewed  COMPREHENSIVE METABOLIC PANEL WITH GFR - Abnormal; Notable for the following components:      Result Value   Glucose, Bld 179 (*)    Total Protein 6.2 (*)    ALT 62 (*)    All other  components within normal limits  CBC WITH DIFFERENTIAL/PLATELET    EKG: None  Radiology: No results found.   Procedures   Medications Ordered in the ED  diphenhydrAMINE  (BENADRYL ) capsule 50 mg (50 mg Oral Given 07/31/24 0650)  famotidine  (PEPCID ) IVPB 20 mg premix (0 mg Intravenous Stopped 07/31/24 1142)  cefTRIAXone  (ROCEPHIN ) 1 g in sodium chloride  0.9 % 100 mL IVPB (0 g Intravenous Stopped 07/31/24 1117)  acetaminophen  (TYLENOL ) tablet 1,000 mg (1,000 mg Oral Given 07/31/24 1045)  sodium chloride  0.9 % bolus 1,000 mL (0 mLs Intravenous Stopped 07/31/24 1238)                                    Medical Decision Making Amount and/or Complexity of Data Reviewed Labs: ordered.  Risk OTC drugs. Prescription drug management.   Patient is a 54 year old female present emergency room today with complaints of right foot rash and redness that has been scaly and red and inflamed for the past few days.  She thinks 3 or 4.  She also states that she has had some on and off itching in her bilateral hands and abdomen for the past month.  No new medications.  CBC unremarkable, CMP with mild ALT elevation 62 will recheck with primary care.  Return precautions to emergency room provided.  Discharged with Keflex  and Pepcid .  Received 1 L of normal saline Pepcid  Rocephin  Tylenol  and Benadryl  here in the emergency department was discharged home with normal vital signs well-appearing with antibiotics.   Final diagnoses:  Cellulitis of right lower extremity    ED Discharge Orders          Ordered    famotidine  (PEPCID ) 20 MG tablet  2 times daily        07/31/24 1322    cephALEXin  (KEFLEX ) 500 MG capsule  4 times daily        07/31/24 1322               Neldon Inoue [place], [place] 07/31/24 1455  "

## 2024-08-08 ENCOUNTER — Ambulatory Visit: Admitting: Physician Assistant

## 2024-08-08 ENCOUNTER — Encounter: Payer: Self-pay | Admitting: Physician Assistant

## 2024-08-08 VITALS — BP 129/80

## 2024-08-08 DIAGNOSIS — L209 Atopic dermatitis, unspecified: Secondary | ICD-10-CM

## 2024-08-08 MED ORDER — CLOBETASOL PROPIONATE 0.05 % EX OINT: 1.0000 | TOPICAL_OINTMENT | Freq: Two times a day (BID) | CUTANEOUS | 1 refills | Status: AC

## 2024-08-08 MED ORDER — TRIAMCINOLONE ACETONIDE 0.1 % EX OINT: 1.0000 | TOPICAL_OINTMENT | Freq: Two times a day (BID) | CUTANEOUS | 1 refills | Status: AC

## 2024-08-08 MED ORDER — TACROLIMUS 0.1 % EX OINT: TOPICAL_OINTMENT | Freq: Two times a day (BID) | CUTANEOUS | 1 refills | Status: AC

## 2024-08-08 MED ORDER — CLOBETASOL PROPIONATE 0.05 % EX OINT: 1.0000 | TOPICAL_OINTMENT | Freq: Two times a day (BID) | CUTANEOUS | 1 refills | Status: DC

## 2024-08-08 MED ORDER — TRIAMCINOLONE ACETONIDE 0.1 % EX OINT: 1.0000 | TOPICAL_OINTMENT | Freq: Two times a day (BID) | CUTANEOUS | 1 refills | Status: DC

## 2024-08-08 NOTE — Progress Notes (Signed)
" ° °  Follow-Up Visit   Subjective  Laura Burns is a 54 y.o. female EXISTING PATIENT who presents for the following: follow up of eczema (biopsy proven) --- it clear up for about 3 weeks then it came back with the deep itch. The only thing that helps the itch is putting hands under hot water. She was taking 3 hydroxizine and Pepcid  and she was still itching. She was seen in the ED last Sunday and was diagnosed with cellulitis of her foot. She was given an IV abx and given Pepcid  and Keflex .  Patient is tearful and crying this morning. Itch is unbearable and new scaly patches on trunk and extremities. 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     The following portions of the chart were reviewed this encounter and updated as appropriate: medications, allergies, medical history  Review of Systems:  No other skin or systemic complaints except as noted in HPI or Assessment and Plan.  Objective  Well appearing patient in no apparent distress; mood and affect are within normal limits.  A focused examination was performed of the following areas: face, trunk, extremities to include hands and feet.   Relevant exam findings are noted in the Assessment and Plan.            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 It is often associated with allergic rhinitis and/or asthma and can require treatment with topical medications, phototherapy, or in severe cases biologic injectable medication (Dupixent ; Adbry) or Oral JAK inhibitors.  Treatment Plan: Restart Clobetasol  ointment and triamcinolone  as directed.   We will start the approval process of DUPIXENT  once she brings her insurance cards.   BSA% - 30%  ITCH factor - 10/10   Plan:  Dupixent  Initiation Indications:  Patient isn't a candidate for systemic therapy with methotrexate or cyclosporine. Patient has been unresponsive to aggressive topical therapy.  Failed Treatments: Topical Steroids and Topical Protopic   Treatment Protocol: 600 mg [place] day 0 then 300 mg [place] every other week  Specific Contraindications Cyclosporine is contraindicated because the patient will not be able to complete the necessary follow-up labs. Methotrexate is contraindicated because the patient will not be able to complete the necessary follow-up labs. Phototherapy is contraindicated because the patient lives too far from the treatment location.   Dupixent  Counseling: I discussed with the patient the risks of dupilumab  including but not limited to eye infection and irritation, cold sores, injection site reactions, worsening of asthma, allergic reactions and increased risk of parasitic infection. Live vaccines should be avoided while taking dupilumab . Dupilumab  will also interact with certain medications such as warfarin and cyclosporine. The patient understands that monitoring is required and they must alert us  or the primary physician if symptoms of infection or other concerning signs are noted.  Dupixent  Monitoring: There is no laboratory monitoring requirement with Dupixent .    ATOPIC DERMATITIS, UNSPECIFIED TYPE    Return in about 1 month (around 09/08/2024) for Eczema follow up.  I, Roseline Hutchinson, CMA, am acting as scribe for Aly Hauser K, PA-C .   Documentation: I have reviewed the above documentation for accuracy and completeness, and I agree with the above.  Tamisha Nordstrom K, PA-C    "

## 2024-08-08 NOTE — Patient Instructions (Signed)

## 2024-08-10 ENCOUNTER — Encounter: Payer: Self-pay | Admitting: Physician Assistant

## 2024-08-10 ENCOUNTER — Ambulatory Visit: Admitting: Physician Assistant

## 2024-08-10 DIAGNOSIS — L2089 Other atopic dermatitis: Secondary | ICD-10-CM | POA: Diagnosis not present

## 2024-08-10 MED ORDER — DUPILUMAB 300 MG/2ML ~~LOC~~ SOAJ
600.0000 mg | Freq: Once | SUBCUTANEOUS | Status: AC
  Administered 2024-08-10: 600 mg via SUBCUTANEOUS

## 2024-08-10 NOTE — Patient Instructions (Signed)

## 2024-08-10 NOTE — Progress Notes (Signed)
" ° °  Follow-Up Visit   Subjective  Laura Burns is a 54 y.o. female who presents for the following: Atopic Dermatitis - She is here for Dupixent  training today  The following portions of the chart were reviewed this encounter and updated as appropriate: medications, allergies, medical history  Review of Systems:  No other skin or systemic complaints except as noted in HPI or Assessment and Plan.  Objective  Well appearing patient in no apparent distress; mood and affect are within normal limits.  Areas Examined: Hands, legs  Relevant physical exam findings are noted in the Assessment and Plan.    Assessment & Plan    ATOPIC DERMATITIS Exam: Scaly pink papules coalescing to plaques 30% BSA Itch factor 10/10   Atopic dermatitis (eczema) is a chronic, relapsing, pruritic condition that can significantly affect quality of life. It is often associated with allergic rhinitis and/or asthma and can require treatment with topical medications, phototherapy, or in severe cases biologic injectable medication (Dupixent ; Adbry) or Oral JAK inhibitors.  Treatment Plan:  Plan: Dupixent  Initiation Indications:  Patient isn't a candidate for systemic therapy with methotrexate or cyclosporine. Patient has been unresponsive to aggressive topical therapy.  Failed Treatments: Topical Steroids and Topical Protopic   Treatment Protocol: 600 mg [place] day 0 then 300 mg [place] every other week - Loading dose given today.   - Patient instructed how to complete Dupixent  Injections with Demonstration Pen - Patient completed Injections while in office at [facility], Mild erythema noted at injection site.   - Advised to continue topicals for break through Eczema Flares - Patient tolerated well and demonstrated understanding - Recommend gentle skin care.    DUPIXENT  NDC: 9975-4084-79 LOT: [id number] EXP: 02/17/2026  Specific Contraindications Cyclosporine is contraindicated because the patient will  not be able to complete the necessary follow-up labs. Methotrexate is contraindicated because the patient will not be able to complete the necessary follow-up labs. Phototherapy is contraindicated because the patient lives too far from the treatment location.   Dupixent  Counseling: I discussed with the patient the risks of dupilumab  including but not limited to eye infection and irritation, cold sores, injection site reactions, worsening of asthma, allergic reactions and increased risk of parasitic infection. Live vaccines should be avoided while taking dupilumab . Dupilumab  will also interact with certain medications such as warfarin and cyclosporine. The patient understands that monitoring is required and they must alert us  or the primary physician if symptoms of infection or other concerning signs are noted.  Dupixent  Monitoring: There is no laboratory monitoring requirement with Dupixent .  OTHER ATOPIC DERMATITIS   This Visit - Dupilumab  SOAJ 600 mg    Return for Follow up as scheduled.  I, Roseline Hutchinson, CMA, am acting as scribe for Johnnetta Holstine K, PA-C .   Documentation: I have reviewed the above documentation for accuracy and completeness, and I agree with the above.  Shail Urbas K, PA-C   "

## 2024-08-16 ENCOUNTER — Encounter: Payer: Self-pay | Admitting: Physician Assistant

## 2025-02-06 ENCOUNTER — Ambulatory Visit: Admitting: Physician Assistant
